# Patient Record
Sex: Female | Born: 1955 | Hispanic: Yes | Marital: Married | Smoking: Never smoker
Health system: Southern US, Community
[De-identification: ages and names within clinical notes are randomized; demographics above are authoritative.]

## PROBLEM LIST (undated history)

## (undated) DIAGNOSIS — K219 Gastro-esophageal reflux disease without esophagitis: Secondary | ICD-10-CM

## (undated) DIAGNOSIS — J45909 Unspecified asthma, uncomplicated: Secondary | ICD-10-CM

## (undated) DIAGNOSIS — E785 Hyperlipidemia, unspecified: Secondary | ICD-10-CM

## (undated) DIAGNOSIS — I10 Essential (primary) hypertension: Secondary | ICD-10-CM

## (undated) HISTORY — PX: NO PAST SURGERIES: SHX2092

---

## 2004-02-15 ENCOUNTER — Emergency Department: Payer: Self-pay | Admitting: Emergency Medicine

## 2004-02-15 ENCOUNTER — Other Ambulatory Visit: Payer: Self-pay

## 2004-02-16 ENCOUNTER — Emergency Department: Payer: Self-pay | Admitting: Internal Medicine

## 2004-02-16 ENCOUNTER — Ambulatory Visit: Payer: Self-pay | Admitting: Emergency Medicine

## 2004-02-16 ENCOUNTER — Other Ambulatory Visit: Payer: Self-pay

## 2004-03-04 ENCOUNTER — Emergency Department: Payer: Self-pay | Admitting: Unknown Physician Specialty

## 2010-01-02 ENCOUNTER — Ambulatory Visit: Payer: Self-pay

## 2011-07-11 DEATH — deceased

## 2013-05-07 ENCOUNTER — Ambulatory Visit: Payer: Self-pay | Admitting: Family Medicine

## 2013-06-20 ENCOUNTER — Emergency Department: Payer: Self-pay | Admitting: Emergency Medicine

## 2013-06-20 LAB — CBC
HCT: 40.6 % (ref 35.0–47.0)
HGB: 13.7 g/dL (ref 12.0–16.0)
MCH: 28.7 pg (ref 26.0–34.0)
MCHC: 33.7 g/dL (ref 32.0–36.0)
MCV: 85 fL (ref 80–100)
Platelet: 363 10*3/uL (ref 150–440)
RBC: 4.76 10*6/uL (ref 3.80–5.20)
RDW: 13.9 % (ref 11.5–14.5)
WBC: 13.7 10*3/uL — ABNORMAL HIGH (ref 3.6–11.0)

## 2013-06-20 LAB — BASIC METABOLIC PANEL
Anion Gap: 3 — ABNORMAL LOW (ref 7–16)
BUN: 16 mg/dL (ref 7–18)
CHLORIDE: 105 mmol/L (ref 98–107)
CO2: 29 mmol/L (ref 21–32)
Calcium, Total: 10 mg/dL (ref 8.5–10.1)
Creatinine: 0.61 mg/dL (ref 0.60–1.30)
EGFR (African American): >60
EGFR (Non-African Amer.): >60
Glucose: 99 mg/dL (ref 65–99)
OSMOLALITY: 275 (ref 275–301)
Potassium: 4.4 mmol/L (ref 3.5–5.1)
SODIUM: 137 mmol/L (ref 136–145)

## 2013-06-20 LAB — URINALYSIS, COMPLETE
Bilirubin,UR: NEGATIVE
Glucose,UR: 50 mg/dL (ref 0–75)
Ketone: NEGATIVE
NITRITE: NEGATIVE
PH: 6 (ref 4.5–8.0)
RBC,UR: 33676 /HPF (ref 0–5)
SQUAMOUS EPITHELIAL: NONE SEEN
Specific Gravity: 1.023 (ref 1.003–1.030)
WBC UR: 7937 /HPF (ref 0–5)

## 2013-06-23 LAB — URINE CULTURE

## 2015-01-31 ENCOUNTER — Other Ambulatory Visit: Payer: Self-pay | Admitting: Family Medicine

## 2015-01-31 DIAGNOSIS — Z1231 Encounter for screening mammogram for malignant neoplasm of breast: Secondary | ICD-10-CM

## 2015-02-09 ENCOUNTER — Ambulatory Visit
Admission: RE | Admit: 2015-02-09 | Discharge: 2015-02-09 | Disposition: A | Discharge status: Home / Self Care | Payer: PRIVATE HEALTH INSURANCE | Source: Ambulatory Visit | Attending: Family Medicine | Admitting: Family Medicine

## 2015-02-09 DIAGNOSIS — Z1231 Encounter for screening mammogram for malignant neoplasm of breast: Secondary | ICD-10-CM | POA: Diagnosis present

## 2016-02-27 ENCOUNTER — Emergency Department: Payer: BLUE CROSS/BLUE SHIELD

## 2016-02-27 ENCOUNTER — Encounter: Payer: Self-pay | Admitting: Internal Medicine

## 2016-02-27 ENCOUNTER — Inpatient Hospital Stay
Admission: EM | Admit: 2016-02-27 | Discharge: 2016-03-01 | DRG: 189 | Disposition: A | Discharge status: Home / Self Care | Payer: BLUE CROSS/BLUE SHIELD | Attending: Internal Medicine | Admitting: Internal Medicine

## 2016-02-27 DIAGNOSIS — E876 Hypokalemia: Secondary | ICD-10-CM | POA: Diagnosis not present

## 2016-02-27 DIAGNOSIS — J189 Pneumonia, unspecified organism: Secondary | ICD-10-CM | POA: Diagnosis not present

## 2016-02-27 DIAGNOSIS — Z800 Family history of malignant neoplasm of digestive organs: Secondary | ICD-10-CM | POA: Diagnosis not present

## 2016-02-27 DIAGNOSIS — Z803 Family history of malignant neoplasm of breast: Secondary | ICD-10-CM | POA: Diagnosis not present

## 2016-02-27 DIAGNOSIS — I4510 Unspecified right bundle-branch block: Secondary | ICD-10-CM | POA: Diagnosis present

## 2016-02-27 DIAGNOSIS — D72829 Elevated white blood cell count, unspecified: Secondary | ICD-10-CM | POA: Diagnosis present

## 2016-02-27 DIAGNOSIS — I10 Essential (primary) hypertension: Secondary | ICD-10-CM | POA: Diagnosis present

## 2016-02-27 DIAGNOSIS — J9601 Acute respiratory failure with hypoxia: Secondary | ICD-10-CM | POA: Diagnosis present

## 2016-02-27 DIAGNOSIS — Z79899 Other long term (current) drug therapy: Secondary | ICD-10-CM | POA: Diagnosis not present

## 2016-02-27 DIAGNOSIS — J441 Chronic obstructive pulmonary disease with (acute) exacerbation: Secondary | ICD-10-CM | POA: Diagnosis present

## 2016-02-27 DIAGNOSIS — J4521 Mild intermittent asthma with (acute) exacerbation: Secondary | ICD-10-CM | POA: Diagnosis not present

## 2016-02-27 DIAGNOSIS — E785 Hyperlipidemia, unspecified: Secondary | ICD-10-CM | POA: Diagnosis present

## 2016-02-27 DIAGNOSIS — K219 Gastro-esophageal reflux disease without esophagitis: Secondary | ICD-10-CM | POA: Diagnosis present

## 2016-02-27 HISTORY — DX: Unspecified asthma, uncomplicated: J45.909

## 2016-02-27 HISTORY — DX: Gastro-esophageal reflux disease without esophagitis: K21.9

## 2016-02-27 HISTORY — DX: Hyperlipidemia, unspecified: E78.5

## 2016-02-27 HISTORY — DX: Essential (primary) hypertension: I10

## 2016-02-27 LAB — CBC WITH DIFFERENTIAL/PLATELET
BASOS ABS: 0.1 10*3/uL (ref 0–0.1)
BASOS PCT: 0 %
Eosinophils Absolute: 0.3 10*3/uL (ref 0–0.7)
Eosinophils Relative: 2 %
HEMATOCRIT: 42.2 % (ref 35.0–47.0)
Hemoglobin: 14.7 g/dL (ref 12.0–16.0)
Lymphocytes Relative: 19 %
Lymphs Abs: 3.4 10*3/uL (ref 1.0–3.6)
MCH: 28.8 pg (ref 26.0–34.0)
MCHC: 34.7 g/dL (ref 32.0–36.0)
MCV: 82.9 fL (ref 80.0–100.0)
MONO ABS: 1.5 10*3/uL — AB (ref 0.2–0.9)
Monocytes Relative: 8 %
NEUTROS ABS: 12.2 10*3/uL — AB (ref 1.4–6.5)
NEUTROS PCT: 71 %
Platelets: 374 10*3/uL (ref 150–440)
RBC: 5.09 MIL/uL (ref 3.80–5.20)
RDW: 13.5 % (ref 11.5–14.5)
WBC: 17.4 10*3/uL — ABNORMAL HIGH (ref 3.6–11.0)

## 2016-02-27 LAB — COMPREHENSIVE METABOLIC PANEL
ALK PHOS: 68 U/L (ref 38–126)
ALT: 21 U/L (ref 14–54)
ANION GAP: 13 (ref 5–15)
AST: 24 U/L (ref 15–41)
Albumin: 4.5 g/dL (ref 3.5–5.0)
BUN: 15 mg/dL (ref 6–20)
CALCIUM: 9.6 mg/dL (ref 8.9–10.3)
CO2: 27 mmol/L (ref 22–32)
CREATININE: 0.63 mg/dL (ref 0.44–1.00)
Chloride: 94 mmol/L — ABNORMAL LOW (ref 101–111)
Glucose, Bld: 119 mg/dL — ABNORMAL HIGH (ref 65–99)
Potassium: 3.2 mmol/L — ABNORMAL LOW (ref 3.5–5.1)
Sodium: 134 mmol/L — ABNORMAL LOW (ref 135–145)
Total Bilirubin: 1.2 mg/dL (ref 0.3–1.2)
Total Protein: 8.5 g/dL — ABNORMAL HIGH (ref 6.5–8.1)

## 2016-02-27 LAB — INFLUENZA PANEL BY PCR (TYPE A & B)
H1N1 flu by pcr: NOT DETECTED
INFLAPCR: NEGATIVE
INFLBPCR: NEGATIVE

## 2016-02-27 LAB — MRSA PCR SCREENING: MRSA by PCR: NEGATIVE

## 2016-02-27 LAB — EXPECTORATED SPUTUM ASSESSMENT W REFEX TO RESP CULTURE

## 2016-02-27 LAB — TROPONIN I

## 2016-02-27 LAB — FIBRIN DERIVATIVES D-DIMER (ARMC ONLY): FIBRIN DERIVATIVES D-DIMER (ARMC): 484 (ref 0–499)

## 2016-02-27 LAB — MAGNESIUM: MAGNESIUM: 2.2 mg/dL (ref 1.7–2.4)

## 2016-02-27 LAB — GLUCOSE, CAPILLARY: GLUCOSE-CAPILLARY: 143 mg/dL — AB (ref 65–99)

## 2016-02-27 MED ORDER — CEFTRIAXONE SODIUM-DEXTROSE 1-3.74 GM-% IV SOLR
1.0000 g | INTRAVENOUS | Status: DC
  Filled 2016-02-27: qty 50

## 2016-02-27 MED ORDER — ATORVASTATIN CALCIUM 20 MG PO TABS
20.0000 mg | ORAL_TABLET | Freq: Every day | ORAL | Status: DC
  Administered 2016-02-28 – 2016-02-29 (×2): 20 mg via ORAL
  Filled 2016-02-27 (×2): qty 1

## 2016-02-27 MED ORDER — ACETAMINOPHEN 325 MG PO TABS
650.0000 mg | ORAL_TABLET | Freq: Four times a day (QID) | ORAL | Status: DC | PRN
Start: 2016-02-27 — End: 2016-03-01

## 2016-02-27 MED ORDER — PANTOPRAZOLE SODIUM 40 MG PO TBEC
40.0000 mg | DELAYED_RELEASE_TABLET | Freq: Every day | ORAL | Status: DC
  Administered 2016-02-28 – 2016-03-01 (×3): 40 mg via ORAL
  Filled 2016-02-27 (×3): qty 1

## 2016-02-27 MED ORDER — DEXTROSE 5 % IV SOLN: 1.0000 g | INTRAVENOUS | Status: DC

## 2016-02-27 MED ORDER — PREDNISONE 20 MG PO TABS: 20.0000 mg | ORAL_TABLET | Freq: Every day | ORAL | Status: DC

## 2016-02-27 MED ORDER — PNEUMOCOCCAL VAC POLYVALENT 25 MCG/0.5ML IJ INJ
0.5000 mL | INJECTION | INTRAMUSCULAR | Status: DC
  Filled 2016-02-27: qty 0.5

## 2016-02-27 MED ORDER — PREDNISONE 10 MG PO TABS: 5.0000 mg | ORAL_TABLET | Freq: Every day | ORAL | Status: DC

## 2016-02-27 MED ORDER — SODIUM CHLORIDE 0.9% FLUSH
3.0000 mL | Freq: Two times a day (BID) | INTRAVENOUS | Status: DC
  Administered 2016-02-27 – 2016-02-29 (×5): 3 mL via INTRAVENOUS

## 2016-02-27 MED ORDER — FLUTICASONE PROPIONATE 50 MCG/ACT NA SUSP
2.0000 | Freq: Every day | NASAL | Status: DC
  Administered 2016-02-28 – 2016-03-01 (×3): 2 via NASAL
  Filled 2016-02-27: qty 16

## 2016-02-27 MED ORDER — METHYLPREDNISOLONE SODIUM SUCC 125 MG IJ SOLR: 60.0000 mg | Freq: Four times a day (QID) | INTRAMUSCULAR | Status: DC

## 2016-02-27 MED ORDER — ALBUTEROL SULFATE (2.5 MG/3ML) 0.083% IN NEBU
5.0000 mg | INHALATION_SOLUTION | Freq: Once | RESPIRATORY_TRACT | Status: AC
  Administered 2016-02-27: 5 mg via RESPIRATORY_TRACT
  Filled 2016-02-27: qty 6

## 2016-02-27 MED ORDER — METHYLPREDNISOLONE SODIUM SUCC 125 MG IJ SOLR
125.0000 mg | Freq: Once | INTRAMUSCULAR | Status: AC
  Administered 2016-02-27: 125 mg via INTRAVENOUS
  Filled 2016-02-27: qty 2

## 2016-02-27 MED ORDER — ACETAMINOPHEN 650 MG RE SUPP: 650.0000 mg | Freq: Four times a day (QID) | RECTAL | Status: DC | PRN

## 2016-02-27 MED ORDER — CEFTRIAXONE SODIUM-DEXTROSE 1-3.74 GM-% IV SOLR
1.0000 g | Freq: Once | INTRAVENOUS | Status: AC
  Administered 2016-02-27: 1 g via INTRAVENOUS
  Filled 2016-02-27: qty 50

## 2016-02-27 MED ORDER — HYDROCHLOROTHIAZIDE 25 MG PO TABS
25.0000 mg | ORAL_TABLET | Freq: Every day | ORAL | Status: DC
  Administered 2016-02-28 – 2016-03-01 (×3): 25 mg via ORAL
  Filled 2016-02-27 (×3): qty 1

## 2016-02-27 MED ORDER — BUDESONIDE 0.5 MG/2ML IN SUSP: 0.5000 mg | Freq: Two times a day (BID) | RESPIRATORY_TRACT | Status: DC

## 2016-02-27 MED ORDER — CEFTRIAXONE SODIUM-DEXTROSE 1-3.74 GM-% IV SOLR: 1.0000 g | INTRAVENOUS | Status: DC

## 2016-02-27 MED ORDER — BUDESONIDE 0.5 MG/2ML IN SUSP
0.5000 mg | Freq: Two times a day (BID) | RESPIRATORY_TRACT | Status: DC
  Administered 2016-02-27 – 2016-03-01 (×6): 0.5 mg via RESPIRATORY_TRACT
  Filled 2016-02-27 (×7): qty 2

## 2016-02-27 MED ORDER — CHLORHEXIDINE GLUCONATE 0.12 % MT SOLN
15.0000 mL | Freq: Two times a day (BID) | OROMUCOSAL | Status: DC
  Administered 2016-02-27: 15 mL via OROMUCOSAL

## 2016-02-27 MED ORDER — ENOXAPARIN SODIUM 40 MG/0.4ML ~~LOC~~ SOLN
SUBCUTANEOUS | Status: AC
Start: 2016-02-27 — End: 2016-02-27
  Administered 2016-02-27: 40 mg via SUBCUTANEOUS
  Filled 2016-02-27: qty 0.4

## 2016-02-27 MED ORDER — IPRATROPIUM-ALBUTEROL 0.5-2.5 (3) MG/3ML IN SOLN
RESPIRATORY_TRACT | Status: AC
  Administered 2016-02-27: 13:00:00
  Filled 2016-02-27: qty 3

## 2016-02-27 MED ORDER — LISINOPRIL 20 MG PO TABS
20.0000 mg | ORAL_TABLET | Freq: Every day | ORAL | Status: DC
  Administered 2016-02-28 – 2016-03-01 (×3): 20 mg via ORAL
  Filled 2016-02-27 (×3): qty 1

## 2016-02-27 MED ORDER — PREDNISONE 10 MG PO TABS: 10.0000 mg | ORAL_TABLET | Freq: Every day | ORAL | Status: DC

## 2016-02-27 MED ORDER — BUDESONIDE 0.5 MG/2ML IN SUSP
0.5000 mg | Freq: Two times a day (BID) | RESPIRATORY_TRACT | Status: DC
  Filled 2016-02-27: qty 2

## 2016-02-27 MED ORDER — PREDNISONE 20 MG PO TABS
30.0000 mg | ORAL_TABLET | Freq: Every day | ORAL | Status: DC
  Administered 2016-03-01: 08:00:00 30 mg via ORAL
  Filled 2016-02-27: qty 1

## 2016-02-27 MED ORDER — DEXTROSE 5 % IV SOLN: 1.0000 g | Freq: Once | INTRAVENOUS | Status: DC

## 2016-02-27 MED ORDER — DEXTROSE 5 % IV SOLN: 500.0000 mg | Freq: Once | INTRAVENOUS | Status: DC

## 2016-02-27 MED ORDER — ENOXAPARIN SODIUM 40 MG/0.4ML ~~LOC~~ SOLN
40.0000 mg | SUBCUTANEOUS | Status: DC
  Administered 2016-02-27 – 2016-02-29 (×3): 40 mg via SUBCUTANEOUS
  Filled 2016-02-27 (×4): qty 0.4

## 2016-02-27 MED ORDER — AZITHROMYCIN 250 MG PO TABS
250.0000 mg | ORAL_TABLET | Freq: Every day | ORAL | Status: DC
  Administered 2016-02-28: 250 mg via ORAL
  Filled 2016-02-27: qty 1

## 2016-02-27 MED ORDER — ORAL CARE MOUTH RINSE
15.0000 mL | Freq: Two times a day (BID) | OROMUCOSAL | Status: DC
  Administered 2016-02-29 – 2016-03-01 (×3): 15 mL via OROMUCOSAL

## 2016-02-27 MED ORDER — IPRATROPIUM-ALBUTEROL 0.5-2.5 (3) MG/3ML IN SOLN
3.0000 mL | Freq: Four times a day (QID) | RESPIRATORY_TRACT | Status: DC
  Administered 2016-02-27 – 2016-02-29 (×8): 3 mL via RESPIRATORY_TRACT
  Filled 2016-02-27 (×9): qty 3

## 2016-02-27 MED ORDER — SODIUM CHLORIDE 0.9% FLUSH
3.0000 mL | Freq: Two times a day (BID) | INTRAVENOUS | Status: DC
  Administered 2016-02-27 – 2016-03-01 (×6): 3 mL via INTRAVENOUS

## 2016-02-27 MED ORDER — INFLUENZA VAC SPLIT QUAD 0.5 ML IM SUSY
0.5000 mL | PREFILLED_SYRINGE | INTRAMUSCULAR | Status: AC
  Administered 2016-02-28: 0.5 mL via INTRAMUSCULAR
  Filled 2016-02-27: qty 0.5

## 2016-02-27 MED ORDER — PREDNISONE 20 MG PO TABS
40.0000 mg | ORAL_TABLET | Freq: Every day | ORAL | Status: AC
  Administered 2016-02-28 – 2016-02-29 (×2): 40 mg via ORAL
  Filled 2016-02-27 (×2): qty 2

## 2016-02-27 MED ORDER — POTASSIUM CHLORIDE CRYS ER 20 MEQ PO TBCR
40.0000 meq | EXTENDED_RELEASE_TABLET | Freq: Once | ORAL | Status: AC
  Administered 2016-02-27: 40 meq via ORAL

## 2016-02-27 MED ORDER — LISINOPRIL-HYDROCHLOROTHIAZIDE 20-25 MG PO TABS: 1.0000 | ORAL_TABLET | Freq: Every day | ORAL | Status: DC

## 2016-02-27 MED ORDER — PREDNISOLONE 5 MG PO TABS
40.0000 mg | ORAL_TABLET | Freq: Every day | ORAL | Status: DC
  Filled 2016-02-27: qty 8

## 2016-02-27 MED ORDER — ORAL CARE MOUTH RINSE: 15.0000 mL | Freq: Two times a day (BID) | OROMUCOSAL | Status: DC

## 2016-02-27 MED ORDER — POTASSIUM CHLORIDE CRYS ER 20 MEQ PO TBCR
EXTENDED_RELEASE_TABLET | ORAL | Status: AC
  Administered 2016-02-27: 40 meq via ORAL
  Filled 2016-02-27: qty 2

## 2016-02-27 MED ORDER — BENZONATATE 100 MG PO CAPS
200.0000 mg | ORAL_CAPSULE | Freq: Three times a day (TID) | ORAL | Status: DC
  Administered 2016-02-27 – 2016-03-01 (×9): 200 mg via ORAL
  Filled 2016-02-27 (×9): qty 2

## 2016-02-27 MED ORDER — AZITHROMYCIN 250 MG PO TABS
500.0000 mg | ORAL_TABLET | Freq: Once | ORAL | Status: AC
  Administered 2016-02-27: 500 mg via ORAL
  Filled 2016-02-27: qty 2

## 2016-02-27 MED ORDER — MAGNESIUM SULFATE 2 GM/50ML IV SOLN
2.0000 g | Freq: Once | INTRAVENOUS | Status: AC
  Administered 2016-02-27: 2 g via INTRAVENOUS
  Filled 2016-02-27: qty 50

## 2016-02-27 MED ORDER — GUAIFENESIN-DM 100-10 MG/5ML PO SYRP
5.0000 mL | ORAL_SOLUTION | ORAL | Status: DC | PRN
  Administered 2016-02-27 – 2016-02-29 (×4): 5 mL via ORAL
  Filled 2016-02-27 (×4): qty 5

## 2016-02-27 NOTE — ED Provider Notes (Signed)
[facility] Emergency Department Provider Note    None    (approximate)  I have reviewed the triage vital signs and the nursing notes.   HISTORY  Chief Complaint Shortness of Breath  Level V caveat:  Acute respiratory distress   HPI Deborah Washington is a 60 y.o. female with a history of asthma without known cardiac disease presents with acute respiratory distress with hypoxia. Patient has been treated for asthma exacerbation for the past several weeks. Has had multiple treatments with steroids as well as albuterol inhalers. Denies any recent fevers or chest pain. No lower extremity swelling. States that she woke up this morning with severe shortness of breath and inability to speak. Patient only able to speak in short phrases. Patient does not wear oxygen at home. She does not smoke. She is currently on a steroid taper.   Past medical history of asthma  Patient Active Problem List   Diagnosis Date Noted  . Acute respiratory failure with hypoxia (HCC) 02/27/2016    No recent surgeries  Prior to Admission medications   Medication Sig Start Date End Date Taking? Authorizing Provider  albuterol (ACCUNEB) 1.25 MG/3ML nebulizer solution Inhale 3 mLs into the lungs every 6 (six) hours as needed.  02/22/16 02/21/17 Yes Historical Provider, MD  albuterol (PROVENTIL HFA;VENTOLIN HFA) 108 (90 Base) MCG/ACT inhaler Inhale into the lungs. 02/22/16  Yes Historical Provider, MD  atorvastatin (LIPITOR) 20 MG tablet Take 20 mg by mouth daily.  11/21/15 11/20/16 Yes Historical Provider, MD  beclomethasone (QVAR) 40 MCG/ACT inhaler Inhale 1 puff into the lungs 2 (two) times daily.  02/22/16  Yes Historical Provider, MD  benzonatate (TESSALON) 200 MG capsule Take 200 mg by mouth 3 (three) times daily.    Yes Historical Provider, MD  fluticasone (FLONASE) 50 MCG/ACT nasal spray Place 2 sprays into the nose daily.    Yes Historical Provider, MD    lisinopril-hydrochlorothiazide (PRINZIDE,ZESTORETIC) 20-25 MG tablet Take 1 tablet by mouth daily.  02/22/16 02/21/17 Yes Historical Provider, MD  omeprazole (PRILOSEC) 20 MG capsule 20 mg daily.  10/20/14  Yes Historical Provider, MD  predniSONE (DELTASONE) 20 MG tablet Take 60 mg by mouth.  02/26/16 03/07/16 Yes Historical Provider, MD    Allergies Review of patient's allergies indicates no known allergies.  Family History  Problem Relation Age of Onset  . Breast cancer Sister 38  . Stomach cancer Mother   . Liver disease Father     Social History Social History  Substance Use Topics  . Smoking status: Never Smoker  . Smokeless tobacco: Never Used  . Alcohol use Yes     Comment: social    Review of Systems Patient denies headaches, rhinorrhea, blurry vision, numbness, shortness of breath, chest pain, edema, cough, abdominal pain, nausea, vomiting, diarrhea, dysuria, fevers, rashes or hallucinations unless otherwise stated above in HPI. ____________________________________________   PHYSICAL EXAM:  VITAL SIGNS: Vitals:   02/27/16 1330 02/27/16 1357  BP: (!) 141/63 (!) 146/67  Pulse: (!) 106 (!) 106  Resp: (!) 26   Temp:  98.5 F (36.9 C)    Constitutional: Alert and oriented. Acutely ill patient who arrives in moderate respiratory distress Eyes: Conjunctivae are normal. PERRL. EOMI. Head: Atraumatic. Nose: No congestion/rhinnorhea. Mouth/Throat: Mucous membranes are moist.  Oropharynx non-erythematous. Neck: No stridor. Painless ROM. No cervical spine tenderness to palpation Hematological/Lymphatic/Immunilogical: No cervical lymphadenopathy. Cardiovascular: Tachycardic, regular rhythm. Grossly normal heart sounds.  Good peripheral circulation. Respiratory: Patient is tripoding, prolonged expiratory  phase, tachypnea diminished breath sounds bilaterally with faint  Inspiratory wheezing.  Gastrointestinal: Soft and nontender. No distention. No abdominal bruits. No CVA  tenderness. Genitourinary:  Musculoskeletal: No lower extremity tenderness nor edema.  No joint effusions. Neurologic:  Normal speech and language. No gross focal neurologic deficits are appreciated. No gait instability. Skin:  Skin is warm, dry and intact. No rash noted. Psychiatric: Mood and affect are normal. Speech and behavior are normal.  ____________________________________________   LABS (all labs ordered are listed, but only abnormal results are displayed)  Results for orders placed or performed during the hospital encounter of 02/27/16 (from the past 24 hour(s))  Magnesium     Status: None   Collection Time: 02/27/16  9:48 AM  Result Value Ref Range   Magnesium 2.2 1.7 - 2.4 mg/dL  Troponin I     Status: None   Collection Time: 02/27/16  9:48 AM  Result Value Ref Range   Troponin I <0.03 <0.03 ng/mL  CBC with Differential/Platelet     Status: Abnormal   Collection Time: 02/27/16  9:48 AM  Result Value Ref Range   WBC 17.4 (H) 3.6 - 11.0 K/uL   RBC 5.09 3.80 - 5.20 MIL/uL   Hemoglobin 14.7 12.0 - 16.0 g/dL   HCT 62.9 52.8 - 41.3 %   MCV 82.9 80.0 - 100.0 fL   MCH 28.8 26.0 - 34.0 pg   MCHC 34.7 32.0 - 36.0 g/dL   RDW 24.4 01.0 - 27.2 %   Platelets 374 150 - 440 K/uL   Neutrophils Relative % 71 %   Neutro Abs 12.2 (H) 1.4 - 6.5 K/uL   Lymphocytes Relative 19 %   Lymphs Abs 3.4 1.0 - 3.6 K/uL   Monocytes Relative 8 %   Monocytes Absolute 1.5 (H) 0.2 - 0.9 K/uL   Eosinophils Relative 2 %   Eosinophils Absolute 0.3 0 - 0.7 K/uL   Basophils Relative 0 %   Basophils Absolute 0.1 0 - 0.1 K/uL  Comprehensive metabolic panel     Status: Abnormal   Collection Time: 02/27/16  9:48 AM  Result Value Ref Range   Sodium 134 (L) 135 - 145 mmol/L   Potassium 3.2 (L) 3.5 - 5.1 mmol/L   Chloride 94 (L) 101 - 111 mmol/L   CO2 27 22 - 32 mmol/L   Glucose, Bld 119 (H) 65 - 99 mg/dL   BUN 15 6 - 20 mg/dL   Creatinine, Ser 5.36 0.44 - 1.00 mg/dL   Calcium 9.6 8.9 - 64.4 mg/dL    Total Protein 8.5 (H) 6.5 - 8.1 g/dL   Albumin 4.5 3.5 - 5.0 g/dL   AST 24 15 - 41 U/L   ALT 21 14 - 54 U/L   Alkaline Phosphatase 68 38 - 126 U/L   Total Bilirubin 1.2 0.3 - 1.2 mg/dL   GFR calc non Af Amer >60 >60 mL/min   GFR calc Af Amer >60 >60 mL/min   Anion gap 13 5 - 15  Fibrin derivatives D-Dimer ([facility] only)     Status: None   Collection Time: 02/27/16  9:48 AM  Result Value Ref Range   Fibrin derivatives D-dimer ([facility]) 484 0 - 499  Influenza panel by PCR (type A & B, H1N1)     Status: None   Collection Time: 02/27/16  1:05 PM  Result Value Ref Range   Influenza A By PCR NEGATIVE NEGATIVE   Influenza B By PCR NEGATIVE NEGATIVE   H1N1 flu by  pcr NOT DETECTED NOT DETECTED  Glucose, capillary     Status: Abnormal   Collection Time: 02/27/16  2:08 PM  Result Value Ref Range   Glucose-Capillary 143 (H) 65 - 99 mg/dL   ____________________________________________  EKG My review and personal interpretation at Time: 9:40   Indication: sob  Rate: 100  Rhythm: sinus Axis: normal Other: st depressions in inferior leads with t wave inversions laterally, ST eelvation in AVR <391mm ____________________________________________  RADIOLOGY  I personally reviewed all radiographic images ordered to evaluate for the above acute complaints and reviewed radiology reports and findings.  These findings were personally discussed with the patient.  Please see medical record for radiology report.  ____________________________________________   PROCEDURES  Procedure(s) performed: none    Critical Care performed: yes CRITICAL CARE Performed by: Willy EddyPatrick Hillery Bhalla   Total critical care time: 40 minutes  Critical care time was exclusive of separately billable procedures and treating other patients.  Critical care was necessary to treat or prevent imminent or life-threatening deterioration.  Critical care was time spent personally by me on the following activities: development of  treatment plan with patient and/or surrogate as well as nursing, discussions with consultants, evaluation of patient's response to treatment, examination of patient, obtaining history from patient or surrogate, ordering and performing treatments and interventions, ordering and review of laboratory studies, ordering and review of radiographic studies, pulse oximetry and re-evaluation of patient's condition.  ____________________________________________   INITIAL IMPRESSION / ASSESSMENT AND PLAN / ED COURSE  Pertinent labs & imaging results that were available during my care of the patient were reviewed by me and considered in my medical decision making (see chart for details).  ZOX:[id number]:Asthma, copd, CHF, pna, ptx, malignancy, Pe, anemia   Deborah LaoMaria T Gonzalez Washington is a 60 y.o. who presents to the ED with acute respiratory distress with hypoxia. Patient does arrive critically ill with new hypoxia at 85% and tachycardia. Examination history is concerning for severe asthma exacerbation. No physical exam findings consistent with CHF. No recent fevers. EKG concerning for ischemia but not an STEMI. We will place patient on BiPAP as well as inhaled belies errors, IV steroids as well as IV magnesium.  The patient will be placed on continuous pulse oximetry and telemetry for monitoring.  Laboratory evaluation will be sent to evaluate for the above complaints.     Clinical Course  Comment By Time  Patient with improvement after multiple nebulizer treatments, IV magnesium and steroids. Based on her worsening condition do feel patient will require IV antibiotics. Patient remains to With new hypoxia. Do feel patient will require admission for further evaluation and management. Willy EddyPatrick Taira Knabe, MD 10/18 1035     ____________________________________________   FINAL CLINICAL IMPRESSION(S) / ED DIAGNOSES  Final diagnoses:  Acute respiratory failure with hypoxemia (HCC)  COPD exacerbation (HCC)  Community  acquired pneumonia, unspecified laterality      NEW MEDICATIONS STARTED DURING THIS VISIT:  Current Discharge Medication List       Note:  This document was prepared using Dragon voice recognition software and may include unintentional dictation errors.    Willy EddyPatrick Eulon Allnutt, MD 02/27/16 [phone]

## 2016-02-27 NOTE — ED Triage Notes (Signed)
Reports asthma flaring up for 2 wks, worse this am.

## 2016-02-27 NOTE — Consult Note (Signed)
Name: Deborah Washington MRN: [id number] DOB: 08/08/1955    ADMISSION DATE:  02/27/2016 CONSULTATION DATE:  02/27/16  REFERRING MD :  Dr. Renae GlossWieting  CHIEF COMPLAINT:  Shortness of breath and wheezing  BRIEF PATIENT DESCRIPTION: 60 y.o. Hispanic female with known history of Asthma, has been having asthma symptoms (SOB and wheezing) for approximately 3 weeks, which have been manageable with her rescue inhaler.  Symptoms continued to progress to where she saw her PCP 10/17 and was prescribed prednisone taper and Q-var inhaler.  Pt symptoms continue to progress, [facility]/18 due to severe Shortness of breath yielding inability to speak in words or phrases.  SIGNIFICANT EVENTS  10/18>>Admission to [facility]  STUDIES:  02/27/16>>H1N1 flu NOT DETECTED by pcr 02/27/16>> Influenza A NEGATIVE pcr 02/27/16>> CXR>>no acute disease processes 02/27/16>>Urinalysis>>1+ Leukocyte Esterase, trace bacteria  HISTORY OF PRESENT ILLNESS:   Pt is a 60 y.o. Hispanic female with PMH of HTN, Hyperlipidemia, GERD, and asthma.  [facility] with respiratory distress with hypoxia.  She woke up this morning with severe shortness of breath and inability to speak due to SOB.  Pt reports approximately a 3 week history of asthma symptoms (SOB, wheezing) which was managed with her rescue inhaler.  Asthma symptoms progressed, thus she followed up with her PCP 10/17 and was presecribed prednisone taper and Q-Air inhaler.  In the ED, pt has received duonebs, IV steroids, IV magnesium, and placed on BiPAP.  Continues to have wheezing and shortness of breath.  PCCM is consulted for further management of asthma exacerbation.  PAST MEDICAL HISTORY :   has a past medical history of Asthma; GERD (gastroesophageal reflux disease); Hyperlipidemia; and Hypertension.  has a past surgical history that includes No past surgeries. Prior to Admission medications   Medication Sig Start Date End Date Taking?  Authorizing Provider  albuterol (ACCUNEB) 1.25 MG/3ML nebulizer solution Inhale 3 mLs into the lungs every 6 (six) hours as needed.  02/22/16 02/21/17 Yes Historical Provider, MD  albuterol (PROVENTIL HFA;VENTOLIN HFA) 108 (90 Base) MCG/ACT inhaler Inhale into the lungs. 02/22/16  Yes Historical Provider, MD  atorvastatin (LIPITOR) 20 MG tablet Take 20 mg by mouth daily.  11/21/15 11/20/16 Yes Historical Provider, MD  beclomethasone (QVAR) 40 MCG/ACT inhaler Inhale 1 puff into the lungs 2 (two) times daily.  02/22/16  Yes Historical Provider, MD  benzonatate (TESSALON) 200 MG capsule Take 200 mg by mouth 3 (three) times daily.    Yes Historical Provider, MD  fluticasone (FLONASE) 50 MCG/ACT nasal spray Place 2 sprays into the nose daily.    Yes Historical Provider, MD  lisinopril-hydrochlorothiazide (PRINZIDE,ZESTORETIC) 20-25 MG tablet Take 1 tablet by mouth daily.  02/22/16 02/21/17 Yes Historical Provider, MD  omeprazole (PRILOSEC) 20 MG capsule 20 mg daily.  10/20/14  Yes Historical Provider, MD  predniSONE (DELTASONE) 20 MG tablet Take 60 mg by mouth.  02/26/16 03/07/16 Yes Historical Provider, MD   No Known Allergies  FAMILY HISTORY:  family history includes Breast cancer (age of onset: 4020) in her sister; Liver disease in her father; Stomach cancer in her mother. SOCIAL HISTORY:  reports that she has never smoked. She has never used smokeless tobacco. She reports that she drinks alcohol. She reports that she does not use drugs.  REVIEW OF SYSTEMS:  Positives in BOLD Constitutional: Negative for fever, chills, weight loss, malaise/fatigue and diaphoresis.  HENT: Negative for hearing loss, ear pain, nosebleeds, congestion, sore throat, neck pain, tinnitus and ear discharge.  Eyes: Negative for blurred vision, double vision, photophobia, pain, discharge and redness.  Respiratory: Negative for cough, hemoptysis, sputum production, shortness of breath, wheezing and stridor.   Cardiovascular:  Negative for chest pain, palpitations, orthopnea, claudication, leg swelling and PND.  Gastrointestinal: Negative for heartburn, nausea, vomiting, abdominal pain, diarrhea, constipation, blood in stool and melena.  Genitourinary: Negative for dysuria, urgency, frequency, hematuria and flank pain.  Musculoskeletal: Negative for myalgias, back pain, joint pain and falls.  Skin: Negative for itching and rash.  Neurological: Negative for dizziness, tingling, tremors, sensory change, speech change, focal weakness, seizures, loss of consciousness, weakness and headaches.  Endo/Heme/Allergies: Negative for environmental allergies and polydipsia. Does not bruise/bleed easily.  SUBJECTIVE:  Pt states her SOB has improved somewhat since receiving treatment in ED  VITAL SIGNS: Temp:  [98.4 F (36.9 C)-98.5 F (36.9 C)] 98.5 F (36.9 C) (10/18 1357) Pulse Rate:  [98-109] 106 (10/18 1357) Resp:  [22-27] 26 (10/18 1330) BP: (123-146)/(58-69) 146/67 (10/18 1357) SpO2:  [85 %-100 %] 93 % (10/18 1357) Weight:  [213 lb 13.5 oz (97 kg)] 213 lb 13.5 oz (97 kg) (10/18 1357)  PHYSICAL EXAMINATION: General:  Well nourished, lying in bed, no acute distress Neuro:  Alert and oriented x4, CN II-XII intact HEENT:  Atraumatic normocephalic, PERRLA, No JVD, neck supple Cardiovascular:  RRR, s1s2, No R/M/G. Lungs:  Expiratory wheezing throughout, no assessory muscle use, symmetric expansion Abdomen:  Obese, bowel sounds present, soft, nontender Musculoskeletal:  ROM intact, no peripheral edema, no cyanosis or clubbing Skin:  No rashes, lesions, or ulcers   Recent Labs Lab 02/27/16 0948  NA 134*  K 3.2*  CL 94*  CO2 27  BUN 15  CREATININE 0.63  GLUCOSE 119*    Recent Labs Lab 02/27/16 0948  HGB 14.7  HCT 42.2  WBC 17.4*  PLT 374   Dg Chest Portable 1 View  Result Date: 02/27/2016 CLINICAL DATA:  Shortness of breath, difficulty breathing EXAM: PORTABLE CHEST 1 VIEW COMPARISON:  05/07/2013  FINDINGS: Lungs are clear.  No pleural effusion or pneumothorax. The heart is normal size. IMPRESSION: No evidence of acute cardiopulmonary disease. Electronically Signed   By: Charline Bills M.D.   On: 02/27/2016 10:20    ASSESSMENT / PLAN:  PULMONARY A:  Acute Asthma Exacerbation P: Duonebs as scheduled and prn IV steroids.  May consider transition to prednisone taper tomorrow Supplemental O2 to keep O2 sats >92%  Pulmonary and Critical Care Medicine [facility] Pager: [phone]  02/27/2016, 3:41 PM   STAFF NOTE: I, Dr. Stephanie Acre have personally reviewed patient's available data, including medical history, events of note, physical examination and test results as part of my evaluation. I have discussed with  NP Blakeney and other care providers such as pharmacist, RN and RRT.  In addition,  I personally evaluated patient and elicited key findings of   HPI:  61 yo female with PMHx of Asthma (mild-intermittent, only on rescue inhaler), GERD, HLD, HTN, seen in consultation for asthma exacerbation.  Per patient and daughter sitting at the bedside. She states that over the last 2 weeks she has had ongoing shortness of breath, acutely got worse overnight, and now with whitish to yellow sputum. She admits to wheezing and some intermittent chest pain along with cough. She has been seeing a nurse practitioner in the outpatient over the last week or so, review of records show that she was given a prescription for prednisone and Qvar yesterday, however has not had a chance  to fill it. She was admitted by the hospitalist service however upon transport, she noted to be descending down to the upper 80s and was placed on BiPAP, at which point pulmonary was consulted. Patient got 1 g of mag in the ER and also 125 mg of Solu-Medrol as part of her initial workup, flu PCR is pending. As for her asthma history, she was diagnosed about 3 years ago, has mild intermittent asthma on requiring a  rescue inhaler, triggers are rapid weather changes, Spring, and more asthma type symptoms during the fall, daughter states that when she does have asthma exacerbations in the past that usually associated with either a bronchitis or some type of pneumonia. I have reviewed her chest x-ray today, there are no infiltrates to suggest a pneumonia.  O:  GEN-NAD, on [place], no use of accessory muscles HEENT-PERRLA, [place]/AT CVS-S1, S2, mild tachycardia LUNGS-shallow BS, faint expiratory wheezes, no rale, no crackles ABD-soft, nt, nd, +BS MSK-no lesions  Recent Labs CBC Latest Ref Rng & Units 02/27/2016 06/20/2013  WBC 3.6 - 11.0 K/uL 17.4(H) 13.7(H)  Hemoglobin 12.0 - 16.0 g/dL 16.1 09.6  Hematocrit 04.5 - 47.0 % 42.2 40.6  Platelets 150 - 440 K/uL 374 363      Recent Labs BMP Latest Ref Rng & Units 02/27/2016 06/20/2013  Glucose 65 - 99 mg/dL 409(W) 99  BUN 6 - 20 mg/dL 15 16  Creatinine 1.19 - 1.00 mg/dL 1.47 8.29  Sodium 562 - 145 mmol/L 134(L) 137  Potassium 3.5 - 5.1 mmol/L 3.2(L) 4.4  Chloride 101 - 111 mmol/L 94(L) 105  CO2 22 - 32 mmol/L 27 29  Calcium 8.9 - 10.3 mg/dL 9.6 13.0       (The following images and results were reviewed by Dr. Dema Severin on 02/27/2016). Dg Chest Portable 1 View  Result Date: 02/27/2016 CLINICAL DATA:  Shortness of breath, difficulty breathing EXAM: PORTABLE CHEST 1 VIEW COMPARISON:  05/07/2013 FINDINGS: Lungs are clear.  No pleural effusion or pneumothorax. The heart is normal size. IMPRESSION: No evidence of acute cardiopulmonary disease. Electronically Signed   By: Charline Bills M.D.   On: 02/27/2016 10:20      A:60 yo F history of mild intermittent asthma, now with asthma exacerbation secondary to environmental changes.  Asthma Asthma exacerbation Hyperlipidemia Hypertension Hypokalemia   P:  -Keep O2 sats greater than 90%, may use supplemental O2 -Continue with steroids, bronchodilators, etc. spirometer, flutter valve -Continue with  CAP treatment, may use Rocephin 3 days, in addition to Z-Pak -Start prednisone 40 mg, taper over 10 days, Pulmicort twice a day 3 days, upon discharge to resume Qvar 40 g 1 puff twice a day -Continue with Flonase -Continue with potassium treatment -Continue Tessalon   .  Rest per NP/medical resident whose note is outlined above and that I agree with  Pulmonary Care Time devoted to patient care services described in this note is  40 Minutes.   This time reflects time of care of this signee Dr Stephanie Acre.  This critical care time does not reflect procedure time, or teaching time or supervisory time of PA/NP/Med-student/Med Resident etc but could involve care discussion time.  Stephanie Acre, MD [facility] Pager [phone] (please enter 7-digits) On Call Pager - [phone] (please enter 7-digits)  Note: This note was prepared with Dragon dictation along with smaller phrase technology. Any transcriptional errors that result from this process are unintentional.

## 2016-02-27 NOTE — Progress Notes (Signed)
Pt. Transported to CCU 18 from the ED while on the BIPAP.

## 2016-02-27 NOTE — ED Notes (Signed)
Attempted x1 w/ no success

## 2016-02-27 NOTE — Progress Notes (Signed)
Pt. Was taken off BIPAP and placed on 2L nasal cannula after entering the CCU. Sa02 were 92%.

## 2016-02-27 NOTE — H&P (Signed)
[facility] -  at [facility]   PATIENT NAME: Deborah Washington    MR#:  [id number]  DATE OF BIRTH:  1955/07/05  DATE OF ADMISSION:  02/27/2016  PRIMARY CARE PHYSICIAN: WHITE, Arlyss Repress, NP   REQUESTING/REFERRING PHYSICIAN: Dr Willy Eddy  CHIEF COMPLAINT:   Chief Complaint  Patient presents with  . Shortness of Breath    HISTORY OF PRESENT ILLNESS:  Deborah Washington  is a 60 y.o. female with a known history of Asthma, presents with shortness of breath Going on for the last 2 weeks. Last night it worsened. She's been coughing up whitish yellow phlegm. She's been wheezing. She has chest pain that comes and goes but worse with coughing. Looks like she was started on prednisone as outpatient. The ER physician called me for admission. After he called May he placed the patient on BiPAP. Hospitalist services were contacted for asthma exacerbation and acute respiratory failure. I used the hospital provided translator to communicate with the patient.  PAST MEDICAL HISTORY:   Past Medical History:  Diagnosis Date  . Asthma   . GERD (gastroesophageal reflux disease)   . Hyperlipidemia   . Hypertension     PAST SURGICAL HISTORY:   Past Surgical History:  Procedure Laterality Date  . NO PAST SURGERIES      SOCIAL HISTORY:   Social History  Substance Use Topics  . Smoking status: Never Smoker  . Smokeless tobacco: Never Used  . Alcohol use Yes     Comment: social    FAMILY HISTORY:   Family History  Problem Relation Age of Onset  . Breast cancer Sister 80  . Stomach cancer Mother   . Liver disease Father     DRUG ALLERGIES:  No Known Allergies  REVIEW OF SYSTEMS:  CONSTITUTIONAL: No fever. Positive for sweating. Occasional fatigue. Positive for weight gain of 5 pounds EYES: No blurred or double vision. Wears glasses and has burning of the eyes. EARS, NOSE, AND THROAT: Right ear tinnitus. Positive for runny nose  and postnasal drip. No sore throat. Tongue with taste change RESPIRATORY: Positive for cough, shortness of breath, and wheezing. No hemoptysis.  CARDIOVASCULAR: Positive for chest pain. No orthopnea, edema.  GASTROINTESTINAL: No nausea, vomiting, diarrhea or abdominal pain. No blood in bowel movements. Occasional constipation GENITOURINARY: Positive for blood in the urine and burning on urination ENDOCRINE: No polyuria, nocturia,  HEMATOLOGY: No anemia, easy bruising or bleeding SKIN: Occasional rash abdomen MUSCULOSKELETAL: Positive for joint pain.   NEUROLOGIC: No tingling, numbness, weakness.  PSYCHIATRY: Occasional anxiety, depression.   MEDICATIONS AT HOME:   Prior to Admission medications   Medication Sig Start Date End Date Taking? Authorizing Provider  albuterol (ACCUNEB) 1.25 MG/3ML nebulizer solution Inhale 3 mLs into the lungs every 6 (six) hours as needed.  02/22/16 02/21/17 Yes Historical Provider, MD  albuterol (PROVENTIL HFA;VENTOLIN HFA) 108 (90 Base) MCG/ACT inhaler Inhale into the lungs. 02/22/16  Yes Historical Provider, MD  atorvastatin (LIPITOR) 20 MG tablet Take 20 mg by mouth daily.  11/21/15 11/20/16 Yes Historical Provider, MD  beclomethasone (QVAR) 40 MCG/ACT inhaler Inhale 1 puff into the lungs 2 (two) times daily.  02/22/16  Yes Historical Provider, MD  benzonatate (TESSALON) 200 MG capsule Take 200 mg by mouth 3 (three) times daily.    Yes Historical Provider, MD  fluticasone (FLONASE) 50 MCG/ACT nasal spray Place 2 sprays into the nose daily.    Yes Historical Provider, MD  lisinopril-hydrochlorothiazide (PRINZIDE,ZESTORETIC) 20-25 MG tablet Take  1 tablet by mouth daily.  02/22/16 02/21/17 Yes Historical Provider, MD  omeprazole (PRILOSEC) 20 MG capsule 20 mg daily.  10/20/14  Yes Historical Provider, MD  predniSONE (DELTASONE) 20 MG tablet Take 60 mg by mouth.  02/26/16 03/07/16 Yes Historical Provider, MD      VITAL SIGNS:  Blood pressure 130/60, pulse (!)  103, resp. rate (!) 26, SpO2 100 %.  PHYSICAL EXAMINATION:  GENERAL:  60 y.o.-year-old patient lying in the bed Breathing better once placed on BiPAP EYES: Pupils equal, round, reactive to light and accommodation. No scleral icterus. Extraocular muscles intact.  HEENT: Head atraumatic, normocephalic. Oropharynx and nasopharynx clear.  NECK:  Supple, no jugular venous distention. No thyroid enlargement, no tenderness.  LUNGS: Decreased breath sounds bilaterally, positive expiratory wheezing. No rales,rhonchi or crepitation. Positive use of accessory muscles of respiration.  CARDIOVASCULAR: S1, S2 tachycardic. No murmurs, rubs, or gallops.  ABDOMEN: Soft, nontender, nondistended. Bowel sounds present. No organomegaly or mass.  EXTREMITIES: No pedal edema, cyanosis, or clubbing.  NEUROLOGIC: Cranial nerves II through XII are intact. Muscle strength 5/5 in all extremities. Sensation intact. Gait not checked.  PSYCHIATRIC: The patient is alert and oriented x 3.  SKIN: No rash, lesion, or ulcer.   LABORATORY PANEL:   CBC  Recent Labs Lab 02/27/16 0948  WBC 17.4*  HGB 14.7  HCT 42.2  PLT 374   ------------------------------------------------------------------------------------------------------------------  Chemistries   Recent Labs Lab 02/27/16 0948  NA 134*  K 3.2*  CL 94*  CO2 27  GLUCOSE 119*  BUN 15  CREATININE 0.63  CALCIUM 9.6  MG 2.2  AST 24  ALT 21  ALKPHOS 68  BILITOT 1.2   ------------------------------------------------------------------------------------------------------------------  Cardiac Enzymes  Recent Labs Lab 02/27/16 0948  TROPONINI <0.03   ------------------------------------------------------------------------------------------------------------------  RADIOLOGY:  Dg Chest Portable 1 View  Result Date: 02/27/2016 CLINICAL DATA:  Shortness of breath, difficulty breathing EXAM: PORTABLE CHEST 1 VIEW COMPARISON:  05/07/2013 FINDINGS: Lungs  are clear.  No pleural effusion or pneumothorax. The heart is normal size. IMPRESSION: No evidence of acute cardiopulmonary disease. Electronically Signed   By: Charline Bills M.D.   On: 02/27/2016 10:20    EKG:   Normal sinus rhythm 82 bpm, PVC, right bundle branch block  IMPRESSION AND PLAN:   1.  Acute respiratory failure with hypoxia. Pulse ox 85% on room air documented. Continue BiPAP until patient able to move better air. Flu swab ordered. 2. Asthma exacerbation. IV Solu-Medrol 60 mg IV every 6 hours. DuoNeb nebulizer solution and budesonide nebulizer. Zithromax ordered. Sputum culture if able. 3. Acute cystitis with hematuria. Rocephin ordered. And on urine culture to previously collected urine 4. Essential hypertension continue her usual medication. 5. Hyperlipidemia unspecified continue atorvastatin 6. GERD on PPI as outpatient and will continue 7. Allergic rhinitis continue Flonase 8. Hypokalemia replace potassium 9. Leukocytosis. Treat with antibiotics for now.  All the records are reviewed and case discussed with ED provider. Management plans discussed with the patient, family and they are in agreement.  CODE STATUS: Full code  TOTAL TIME TAKING CARE OF THIS PATIENT: 50 minutes. Patient will be admitted to the CCU stepdown because the patient is continuously on BiPAP at this point. Since the patient is not moving much air we have to watch her respiratory status closely. Patient is critically ill.   Alford Highland M.D on 02/27/2016 at 12:15 PM  Between 7am to 6pm - Pager - [phone]  After 6pm call admission pager [phone]  [facility] Office  [phone]  CC: Primary care physician; WHITE, Arlyss RepressELIZABETH BURNEY, NP

## 2016-02-27 NOTE — Therapy (Signed)
Patient educated on the flutter valve. Good technique.

## 2016-02-28 LAB — BASIC METABOLIC PANEL
Anion gap: 9 (ref 5–15)
BUN: 21 mg/dL — ABNORMAL HIGH (ref 6–20)
CALCIUM: 9.5 mg/dL (ref 8.9–10.3)
CO2: 29 mmol/L (ref 22–32)
CREATININE: 0.7 mg/dL (ref 0.44–1.00)
Chloride: 97 mmol/L — ABNORMAL LOW (ref 101–111)
GLUCOSE: 114 mg/dL — AB (ref 65–99)
Potassium: 3.7 mmol/L (ref 3.5–5.1)
Sodium: 135 mmol/L (ref 135–145)

## 2016-02-28 LAB — CBC
HCT: 40 % (ref 35.0–47.0)
Hemoglobin: 13.9 g/dL (ref 12.0–16.0)
MCH: 29 pg (ref 26.0–34.0)
MCHC: 34.7 g/dL (ref 32.0–36.0)
MCV: 83.7 fL (ref 80.0–100.0)
PLATELETS: 451 10*3/uL — AB (ref 150–440)
RBC: 4.78 MIL/uL (ref 3.80–5.20)
RDW: 13.7 % (ref 11.5–14.5)
WBC: 16.3 10*3/uL — ABNORMAL HIGH (ref 3.6–11.0)

## 2016-02-28 LAB — PROCALCITONIN

## 2016-02-28 MED ORDER — DIPHENHYDRAMINE HCL 25 MG PO CAPS
ORAL_CAPSULE | ORAL | Status: AC
  Administered 2016-02-28: 25 mg via ORAL
  Filled 2016-02-28: qty 1

## 2016-02-28 MED ORDER — DIPHENHYDRAMINE HCL 25 MG PO CAPS
25.0000 mg | ORAL_CAPSULE | Freq: Once | ORAL | Status: AC
  Administered 2016-02-28: 25 mg via ORAL

## 2016-02-28 NOTE — Progress Notes (Signed)
[facility] - [place] at [facility]   PATIENT NAME: Deborah Washington    MR#:  [id number]  DATE OF BIRTH:  06/07/1955  SUBJECTIVE:  CHIEF COMPLAINT:   Chief Complaint  Patient presents with  . Shortness of Breath    Came with acute asthma exacerbation and was requiring BiPAP yesterday evening. Improved with treatment, currently on nasal cannula oxygen supplementation.  REVIEW OF SYSTEMS:  CONSTITUTIONAL: No fever, fatigue or weakness.  EYES: No blurred or double vision.  EARS, NOSE, AND THROAT: No tinnitus or ear pain.  RESPIRATORY: No cough, shortness of breath, wheezing or hemoptysis.  CARDIOVASCULAR: No chest pain, orthopnea, edema.  GASTROINTESTINAL: No nausea, vomiting, diarrhea or abdominal pain.  GENITOURINARY: No dysuria, hematuria.  ENDOCRINE: No polyuria, nocturia,  HEMATOLOGY: No anemia, easy bruising or bleeding SKIN: No rash or lesion. MUSCULOSKELETAL: No joint pain or arthritis.   NEUROLOGIC: No tingling, numbness, weakness.  PSYCHIATRY: No anxiety or depression.   ROS  DRUG ALLERGIES:  No Known Allergies  VITALS:  Blood pressure (!) 118/58, pulse 86, temperature 98.4 F (36.9 C), temperature source Oral, resp. rate (!) 26, height 5\' 4"  (1.626 m), weight 77 kg (169 lb 12.1 oz), SpO2 92 %.  PHYSICAL EXAMINATION:  GENERAL:  60 y.o.-year-old patient lying in the bed with no acute distress.  EYES: Pupils equal, round, reactive to light and accommodation. No scleral icterus. Extraocular muscles intact.  HEENT: Head atraumatic, normocephalic. Oropharynx and nasopharynx clear.  NECK:  Supple, no jugular venous distention. No thyroid enlargement, no tenderness.  LUNGS: Normal breath sounds bilaterally, some wheezing,no crepitation. No use of accessory muscles of respiration.  CARDIOVASCULAR: S1, S2 normal. No murmurs, rubs, or gallops.  ABDOMEN: Soft, nontender, nondistended. Bowel sounds present. No organomegaly or mass.  EXTREMITIES: No pedal  edema, cyanosis, or clubbing.  NEUROLOGIC: Cranial nerves II through XII are intact. Muscle strength 5/5 in all extremities. Sensation intact. Gait not checked.  PSYCHIATRIC: The patient is alert and oriented x 3.  SKIN: No obvious rash, lesion, or ulcer.   Physical Exam LABORATORY PANEL:   CBC  Recent Labs Lab 02/28/16 0623  WBC 16.3*  HGB 13.9  HCT 40.0  PLT 451*   ------------------------------------------------------------------------------------------------------------------  Chemistries   Recent Labs Lab 02/27/16 0948 02/28/16 0623  NA 134* 135  K 3.2* 3.7  CL 94* 97*  CO2 27 29  GLUCOSE 119* 114*  BUN 15 21*  CREATININE 0.63 0.70  CALCIUM 9.6 9.5  MG 2.2  --   AST 24  --   ALT 21  --   ALKPHOS 68  --   BILITOT 1.2  --    ------------------------------------------------------------------------------------------------------------------  Cardiac Enzymes  Recent Labs Lab 02/27/16 0948  TROPONINI <0.03   ------------------------------------------------------------------------------------------------------------------  RADIOLOGY:  Dg Chest Portable 1 View  Result Date: 02/27/2016 CLINICAL DATA:  Shortness of breath, difficulty breathing EXAM: PORTABLE CHEST 1 VIEW COMPARISON:  05/07/2013 FINDINGS: Lungs are clear.  No pleural effusion or pneumothorax. The heart is normal size. IMPRESSION: No evidence of acute cardiopulmonary disease. Electronically Signed   By: Charline BillsSriyesh  Krishnan M.D.   On: 02/27/2016 10:20    ASSESSMENT AND PLAN:   Active Problems:   Acute respiratory failure with hypoxemia (HCC)   Community acquired pneumonia   Mild intermittent asthma with acute exacerbation   Hypokalemia  1.  Acute respiratory failure with hypoxia. Pulse ox 85% on room air documented.    Given bipap initially, now off. On nasal canula.  2. Asthma exacerbation. IV  Solu-Medrol 60 mg IV every 6 hours. DuoNeb nebulizer solution and budesonide nebulizer. Zithromax  ordered.  MRSA screen is negative and Procalcitonin is low, so stop Abx.   Appreciated pulm help. 3. Acute cystitis with hematuria. Rocephin ordered. And on urine culture to previously collected urine 4. Essential hypertension continue her usual medication. 5. Hyperlipidemia unspecified continue atorvastatin. 6. GERD on PPI as outpatient and will continue 7. Allergic rhinitis continue Flonase 8. Hypokalemia replace potassium 9. Leukocytosis. Due to acute stress.    All the records are reviewed and case discussed with Care Management/Social Workerr. Management plans discussed with the patient, family and they are in agreement.  CODE STATUS: full.  TOTAL TIME TAKING CARE OF THIS PATIENT: 35 minutes.   Discussed with her husband and daughter in room.  POSSIBLE D/C IN 1-2 DAYS, DEPENDING ON CLINICAL CONDITION.   Altamese Dilling M.D on 02/28/2016   Between 7am to 6pm - Pager - [phone]  After 6pm go to [email or web address] - Social research officer, government  [facility]  Office  [phone]  CC: Primary care physician; WHITE, Arlyss Repress, NP  Note: This dictation was prepared with Dragon dictation along with smaller phrase technology. Any transcriptional errors that result from this process are unintentional.

## 2016-02-28 NOTE — Progress Notes (Signed)
PULMONARY / CRITICAL CARE MEDICINE   Name: Deborah Washington MRN: [id number] DOB: Mar 17, 1956    ADMISSION DATE:  02/27/2016  BRIEF HISTORY: 60 yo female with PMHx of Asthma (mild-intermittent, only on rescue inhaler), GERD, HLD, HTN, seen in consultation for asthma exacerbation. Per patient and daughter sitting at the bedside. She states that over the last 2 weeks she has had ongoing shortness of breath, acutely got worse overnight, and now with whitish to yellow sputum. She admits to wheezing and some intermittent chest pain along with cough. She has been seeing a nurse practitioner in the outpatient over the last week or so, review of records show that she was given a prescription for prednisone and Qvar yesterday, however has not had a chance to fill it. She was admitted by the hospitalist service however upon transport, she noted to be descending down to the upper 80s and was placed on BiPAP, at which point pulmonary was consulted. Patient got 1 g of mag in the ER and also 125 mg of Solu-Medrol as part of her initial workup, flu PCR is pending. As for her asthma history, she was diagnosed about 3 years ago, has mild intermittent asthma on requiring a rescue inhaler, triggers are rapid weather changes, Spring, and more asthma type symptoms during the fall, daughter states that when she does have asthma exacerbations in the past that usually associated with either a bronchitis or some type of pneumonia. I have reviewed her chest x-ray today, there are no infiltrates to suggest a pneumonia.  SUBJECTIVE:  Doing well this morning, weaned of bipap and on 2-3 L supplemental o2, stable to transfer to medsurg unit.   VITAL SIGNS: Temp:  [98.3 F (36.8 C)-98.5 F (36.9 C)] 98.4 F (36.9 C) (10/19 1145) Pulse Rate:  [67-114] 86 (10/19 1145) Resp:  [17-30] 26 (10/19 1145) BP: (118-163)/(56-104) 118/58 (10/19 1145) SpO2:  [88 %-97 %] 92 % (10/19 1405) Weight:  [169 lb 12.1 oz (77 kg)] 169 lb 12.1  oz (77 kg) (10/19 0437) HEMODYNAMICS:   VENTILATOR SETTINGS:   INTAKE / OUTPUT:  Intake/Output Summary (Last 24 hours) at 02/28/16 1550 Last data filed at 02/28/16 0000  Gross per 24 hour  Intake              600 ml  Output              675 ml  Net              -75 ml    Review of Systems  Constitutional: Negative for chills and fever.  HENT: Negative for hearing loss.   Eyes: Negative for blurred vision and double vision.  Respiratory: Positive for cough, sputum production and shortness of breath. Negative for hemoptysis and wheezing.   Cardiovascular: Negative for chest pain.  Gastrointestinal: Negative for heartburn and nausea.  Genitourinary: Negative for dysuria.  Musculoskeletal: Negative for myalgias.  Skin: Negative for rash.  Neurological: Positive for headaches. Negative for dizziness.  Endo/Heme/Allergies: Does not bruise/bleed easily.  Psychiatric/Behavioral: Negative for depression.    Physical Exam  Constitutional: She is oriented to person, place, and time and well-developed, well-nourished, and in no distress.  HENT:  Head: Normocephalic and atraumatic.  Right Ear: External ear normal.  Left Ear: External ear normal.  Eyes: Conjunctivae and EOM are normal. Pupils are equal, round, and reactive to light.  Neck: Normal range of motion. Neck supple.  Cardiovascular: Normal rate, regular rhythm and normal heart sounds.   Pulmonary/Chest: No  respiratory distress.  Mild exp wheezes, coarse upper airway sounds, dec basilar BS  Abdominal: She exhibits no distension. There is no tenderness. There is no rebound and no guarding.  Musculoskeletal: Normal range of motion. She exhibits no edema.  Neurological: She is alert and oriented to person, place, and time.  Skin: Skin is warm and dry.  Nursing note and vitals reviewed.    LABS:  CBC  Recent Labs Lab 02/27/16 0948 02/28/16 0623  WBC 17.4* 16.3*  HGB 14.7 13.9  HCT 42.2 40.0  PLT 374 451*    Coag's No results for input(s): APTT, INR in the last 168 hours. BMET  Recent Labs Lab 02/27/16 0948 02/28/16 0623  NA 134* 135  K 3.2* 3.7  CL 94* 97*  CO2 27 29  BUN 15 21*  CREATININE 0.63 0.70  GLUCOSE 119* 114*   Electrolytes  Recent Labs Lab 02/27/16 0948 02/28/16 0623  CALCIUM 9.6 9.5  MG 2.2  --    Sepsis Markers  Recent Labs Lab 02/28/16 0636  PROCALCITON <0.10   ABG No results for input(s): PHART, PCO2ART, PO2ART in the last 168 hours. Liver Enzymes  Recent Labs Lab 02/27/16 0948  AST 24  ALT 21  ALKPHOS 68  BILITOT 1.2  ALBUMIN 4.5   Cardiac Enzymes  Recent Labs Lab 02/27/16 0948  TROPONINI <0.03   Glucose  Recent Labs Lab 02/27/16 1408  GLUCAP 143*    Imaging No results found.  LINES:   CULTURES: Sputum 10/18>  ANTIBIOTICS Rocephin 10/18> Azithro 10/18>   ASSESSMENT / PLAN: A:60 yo F history of mild intermittent asthma, now with asthma exacerbation secondary to environmental changes on intermittent Bipap, now weaned off, stable to transfer to floor care.   Asthma Asthma exacerbation Hyperlipidemia Hypertension Hypokalemia   P:  -KeepO2 sats greater than 90%, may use supplemental O2 -Continue with steroids, bronchodilators, etc. spirometer, flutter valve -Continue with CAP treatment, may use Rocephin 3 days, in addition to Z-Pak -Start prednisone 40 mg, taper over 10 days, Pulmicort twice a day 3 days, upon discharge to resume Qvar 40 g 1 puff twice a day -Continue with Flonase -Continue with potassium replacement PRN -Continue Tessalon   Thank you for consulting [facility], Please feel free to contacts us with any questions at [phone] (please enter 7-digits).  Pulmonary Consult time - 35 mins    Stephanie AcreVishal Shealeigh Dunstan, MD [facility] Pager [phone]- [phone] (please enter 7-digits) On Call Pager - [phone] (please enter 7-digits)  Note: This note  was prepared with Dragon dictation along with smaller phrase technology. Any transcriptional errors that result from this process are unintentional.

## 2016-02-28 NOTE — Progress Notes (Signed)
[facility]  [facility] transported to room 115 via bed.

## 2016-02-29 MED ORDER — GUAIFENESIN ER 600 MG PO TB12
600.0000 mg | ORAL_TABLET | Freq: Two times a day (BID) | ORAL | Status: DC
  Administered 2016-02-29 – 2016-03-01 (×3): 600 mg via ORAL
  Filled 2016-02-29 (×3): qty 1

## 2016-02-29 MED ORDER — DEXTROMETHORPHAN POLISTIREX ER 30 MG/5ML PO SUER
30.0000 mg | Freq: Two times a day (BID) | ORAL | Status: DC
  Administered 2016-02-29 – 2016-03-01 (×3): 30 mg via ORAL
  Filled 2016-02-29 (×4): qty 5

## 2016-02-29 MED ORDER — IPRATROPIUM-ALBUTEROL 0.5-2.5 (3) MG/3ML IN SOLN
3.0000 mL | Freq: Three times a day (TID) | RESPIRATORY_TRACT | Status: DC
  Administered 2016-02-29 – 2016-03-01 (×3): 3 mL via RESPIRATORY_TRACT
  Filled 2016-02-29 (×3): qty 3

## 2016-02-29 MED ORDER — DM-GUAIFENESIN ER 30-600 MG PO TB12
1.0000 | ORAL_TABLET | Freq: Two times a day (BID) | ORAL | Status: DC
  Filled 2016-02-29 (×2): qty 1

## 2016-02-29 NOTE — Progress Notes (Signed)
Pt ambulated from room and walked around the nurses station on room air. Upon walking patient O2 was 90%. While she ambulated her oxygen level started to destat. During ambulation the lowest o2 saturation was around 82%. She did not show any signs of distress. Her oxygen went back up to 90% within a minute after ambulation. Patient is currently weaned off of 3 L and is on 1 L and stating in the 90s .

## 2016-02-29 NOTE — Progress Notes (Signed)
While rounding, CH made initial visit in room 115. Family was bedside. Pt did not speak english, and so spoke with her as son interpreted. Pt was appreciative of the visit and welcomed prayer, but did not want it in the room. I was asked to pray for her in the [place] and will comply. CH is available for follow up as needed.    02/29/16 1200  Clinical Encounter Type  Visited With Patient and family together  Visit Type Initial;Spiritual support  Referral From Nurse  Spiritual Encounters  Spiritual Needs Prayer

## 2016-02-29 NOTE — Progress Notes (Signed)
[employer] [facility] - [place] at [facility]   PATIENT NAME: Deborah Washington    MR#:  [id number]  DATE OF BIRTH:  [birth year]  SUBJECTIVE:  CHIEF COMPLAINT:   Chief Complaint  Patient presents with  . Shortness of Breath    Came with acute asthma exacerbation and was requiring BiPAP yesterday evening. Improved with treatment, currently on nasal cannula oxygen supplementation.   Tried to taper oxygen- still desaturated.  REVIEW OF SYSTEMS:  CONSTITUTIONAL: No fever, fatigue or weakness.  EYES: No blurred or double vision.  EARS, NOSE, AND THROAT: No tinnitus or ear pain.  RESPIRATORY: No cough, shortness of breath, wheezing or hemoptysis.  CARDIOVASCULAR: No chest pain, orthopnea, edema.  GASTROINTESTINAL: No nausea, vomiting, diarrhea or abdominal pain.  GENITOURINARY: No dysuria, hematuria.  ENDOCRINE: No polyuria, nocturia,  HEMATOLOGY: No anemia, easy bruising or bleeding SKIN: No rash or lesion. MUSCULOSKELETAL: No joint pain or arthritis.   NEUROLOGIC: No tingling, numbness, weakness.  PSYCHIATRY: No anxiety or depression.   ROS  DRUG ALLERGIES:  No Known Allergies  VITALS:  Blood pressure (!) 117/56, pulse 71, temperature 98.3 F (36.8 C), temperature source Oral, resp. rate 17, height 5\' 4"  (1.626 m), weight 77 kg (169 lb 12.1 oz), SpO2 92 %.  PHYSICAL EXAMINATION:  GENERAL:  60 y.o.-year-old patient lying in the bed with no acute distress.  EYES: Pupils equal, round, reactive to light and accommodation. No scleral icterus. Extraocular muscles intact.  HEENT: Head atraumatic, normocephalic. Oropharynx and nasopharynx clear.  NECK:  Supple, no jugular venous distention. No thyroid enlargement, no tenderness.  LUNGS: Normal breath sounds bilaterally, some wheezing,no crepitation. No use of accessory muscles of respiration.  CARDIOVASCULAR: S1, S2 normal. No murmurs, rubs, or gallops.  ABDOMEN: Soft, nontender, nondistended. Bowel sounds present. No  organomegaly or mass.  EXTREMITIES: No pedal edema, cyanosis, or clubbing.  NEUROLOGIC: Cranial nerves II through XII are intact. Muscle strength 5/5 in all extremities. Sensation intact. Gait not checked.  PSYCHIATRIC: The patient is alert and oriented x 3.  SKIN: No obvious rash, lesion, or ulcer.   Physical Exam LABORATORY PANEL:   CBC  Recent Labs Lab 02/28/16 0623  WBC 16.3*  HGB 13.9  HCT 40.0  PLT 451*   ------------------------------------------------------------------------------------------------------------------  Chemistries   Recent Labs Lab 02/27/16 0948 02/28/16 0623  NA 134* 135  K 3.2* 3.7  CL 94* 97*  CO2 27 29  GLUCOSE 119* 114*  BUN 15 21*  CREATININE 0.63 0.70  CALCIUM 9.6 9.5  MG 2.2  --   AST 24  --   ALT 21  --   ALKPHOS 68  --   BILITOT 1.2  --    ------------------------------------------------------------------------------------------------------------------  Cardiac Enzymes  Recent Labs Lab 02/27/16 0948  TROPONINI <0.03   ------------------------------------------------------------------------------------------------------------------  RADIOLOGY:  No results found.  ASSESSMENT AND PLAN:   Active Problems:   Acute respiratory failure with hypoxemia (HCC)   Community acquired pneumonia   Mild intermittent asthma with acute exacerbation   Hypokalemia  1.  Acute respiratory failure with hypoxia. Pulse ox 85% on room air documented.    Given bipap initially, now off. On nasal canula.    Could not take her off of oxygen. 2. Asthma exacerbation. IV Solu-Medrol 60 mg IV every 6 hours. DuoNeb nebulizer solution and budesonide nebulizer. Zithromax ordered.  MRSA screen is negative and Procalcitonin is low, so stop Abx.   Appreciated pulm help.   Switched to oral steroids. 3. Acute cystitis with hematuria.  This was a mistake, that was seen in old urinalysis on this admission.   Not present on this admission. 4. Essential  hypertension continue her usual medication. 5. Hyperlipidemia unspecified continue atorvastatin. 6. GERD on PPI as outpatient and will continue 7. Allergic rhinitis continue Flonase 8. Hypokalemia replace potassium 9. Leukocytosis. Due to acute stress.    All the records are reviewed and case discussed with Care Management/Social Workerr. Management plans discussed with the patient, family and they are in agreement.  CODE STATUS: full.  TOTAL TIME TAKING CARE OF THIS PATIENT: 35 minutes.   Discussed with her husband and daughter in room.  POSSIBLE D/C IN 1-2 DAYS, DEPENDING ON CLINICAL CONDITION.   Altamese DillingVACHHANI, Maevyn Riordan M.D on 02/29/2016   Between 7am to 6pm - Pager - [phone]  After 6pm go to [email or web address] - Social research officer, governmentpassword EPAS [facility]  [facility]  Office  [phone]  CC: Primary care physician; WHITE, Arlyss RepressELIZABETH BURNEY, NP  Note: This dictation was prepared with Dragon dictation along with smaller phrase technology. Any transcriptional errors that result from this process are unintentional.

## 2016-03-01 LAB — CULTURE, RESPIRATORY W GRAM STAIN: Culture: NORMAL

## 2016-03-01 LAB — URINE CULTURE: CULTURE: NO GROWTH

## 2016-03-01 LAB — CULTURE, RESPIRATORY

## 2016-03-01 LAB — PROCALCITONIN: Procalcitonin: <0.1 ng/mL

## 2016-03-01 MED ORDER — PREDNISONE 20 MG PO TABS: ORAL_TABLET | ORAL | Status: DC

## 2016-03-01 NOTE — Progress Notes (Signed)
[facility] - [place] at [facility]        [name] was admitted to the Hospital on 02/27/2016 and Discharged  03/01/2016 and should be excused from work/school   for 14  days starting 02/27/2016 , follow up PCP for reevaluation to decide whether to return to work. Shaune Pollackhen, [name] M.D on 03/01/2016,at 9:50 AM  [facility] - [place] at [facility]    Office  [phone]

## 2016-03-01 NOTE — Progress Notes (Signed)
   Patient Saturations on Room Air at Rest = 90%  Patient Saturations on Room Air while Ambulating = 88%    

## 2016-03-01 NOTE — Discharge Summary (Signed)
[facility] - [place] at [facility]   PATIENT NAME: Deborah Washington    MR#:  [id number]  DATE OF BIRTH:  1955-08-11  DATE OF ADMISSION:  02/27/2016   ADMITTING PHYSICIAN: Alford Highland, MD  DATE OF DISCHARGE: 03/01/2016  PRIMARY CARE PHYSICIAN: WHITE, Arlyss Repress, NP   ADMISSION DIAGNOSIS:  COPD exacerbation (HCC) [J44.1] Acute respiratory failure with hypoxemia (HCC) [J96.01] Community acquired pneumonia, unspecified laterality [J18.9] DISCHARGE DIAGNOSIS:  Active Problems:   Acute respiratory failure with hypoxemia (HCC)   Community acquired pneumonia   Mild intermittent asthma with acute exacerbation   Hypokalemia  SECONDARY DIAGNOSIS:   Past Medical History:  Diagnosis Date  . Asthma   . GERD (gastroesophageal reflux disease)   . Hyperlipidemia   . Hypertension    HOSPITAL COURSE:   Acute respiratory failure with hypoxemia (HCC)   Community acquired pneumonia   Mild intermittent asthma with acute exacerbation   Hypokalemia  1. Acute respiratory failure with hypoxia. Pulse ox 88% on exertion in room air.    Given bipap initially, now off. On nasal canula.    Could not take her off of oxygen. She needs home O2 [place] 1-2 L.  2. Asthma exacerbation. She has been treated with IV Solu-Medrol 60 mg IV every 6 hours. Changed prednisone po. DuoNeb nebulizer solution and budesonide nebulizer. Zithromax ordered.  MRSA screen is negative and Procalcitonin is low, so stop Abx.   Appreciated pulm help.  3. Acute cystitis with hematuria.    This was a mistake, that was seen in old urinalysis on this admission.   Not present on this admission. 4. Essential hypertension continue her usual medication. 5. Hyperlipidemia unspecified continue atorvastatin. 6. GERD on PPI as outpatient and will continue 7. Allergic rhinitis continue Flonase 8. Hypokalemia replaced potassium, improved. 9. Leukocytosis. Due to acute stress.  DISCHARGE CONDITIONS:    Stable, discharge to home with O2 [place] 1-2 L. CONSULTS OBTAINED:  Treatment Team:  Stephanie Acre, MD DRUG ALLERGIES:  No Known Allergies DISCHARGE MEDICATIONS:     Medication List    TAKE these medications   albuterol 1.25 MG/3ML nebulizer solution Commonly known as:  ACCUNEB Inhale 3 mLs into the lungs every 6 (six) hours as needed.   albuterol 108 (90 Base) MCG/ACT inhaler Commonly known as:  PROVENTIL HFA;VENTOLIN HFA Inhale into the lungs.   atorvastatin 20 MG tablet Commonly known as:  LIPITOR Take 20 mg by mouth daily.   beclomethasone 40 MCG/ACT inhaler Commonly known as:  QVAR Inhale 1 puff into the lungs 2 (two) times daily.   benzonatate 200 MG capsule Commonly known as:  TESSALON Take 200 mg by mouth 3 (three) times daily.   fluticasone 50 MCG/ACT nasal spray Commonly known as:  FLONASE Place 2 sprays into the nose daily.   lisinopril-hydrochlorothiazide 20-25 MG tablet Commonly known as:  PRINZIDE,ZESTORETIC Take 1 tablet by mouth daily.   omeprazole 20 MG capsule Commonly known as:  PRILOSEC 20 mg daily.   predniSONE 20 MG tablet Commonly known as:  DELTASONE 40 mg po daily for 2 days, 20 mg po daily for 2 days, 10 mg po daily for 3 days. What changed:  how much to take  how to take this  additional instructions        DISCHARGE INSTRUCTIONS:   DIET:  Heart healthy diet. DISCHARGE CONDITION:  Stable. ACTIVITY:  As tolerated. OXYGEN:  Home Oxygen:  [place] 1-2 L. Oxygen Delivery: yes DISCHARGE LOCATION:  Home.  If  you experience worsening of your admission symptoms, develop shortness of breath, life threatening emergency, suicidal or homicidal thoughts you must seek medical attention immediately by calling 911 or calling your MD immediately  if symptoms less severe.  You Must read complete instructions/literature along with all the possible adverse reactions/side effects for all the Medicines you take and that have been prescribed to  you. Take any new Medicines after you have completely understood and accpet all the possible adverse reactions/side effects.   Please note  You were cared for by a hospitalist during your hospital stay. If you have any questions about your discharge medications or the care you received while you were in the hospital after you are discharged, you can call the unit and asked to speak with the hospitalist on call if the hospitalist that took care of you is not available. Once you are discharged, your primary care physician will handle any further medical issues. Please note that NO REFILLS for any discharge medications will be authorized once you are discharged, as it is imperative that you return to your primary care physician (or establish a relationship with a primary care physician if you do not have one) for your aftercare needs so that they can reassess your need for medications and monitor your lab values.    On the day of Discharge:  VITAL SIGNS:  Blood pressure (!) 116/56, pulse 88, temperature 98.3 F (36.8 C), temperature source Oral, resp. rate 20, height 5\' 4"  (1.626 m), weight 169 lb 12.1 oz (77 kg), SpO2 90 %. PHYSICAL EXAMINATION:  GENERAL:  60 y.o.-year-old patient lying in the bed with no acute distress.  EYES: Pupils equal, round, reactive to light and accommodation. No scleral icterus. Extraocular muscles intact.  HEENT: Head atraumatic, normocephalic. Oropharynx and nasopharynx clear.  NECK:  Supple, no jugular venous distention. No thyroid enlargement, no tenderness.  LUNGS: Normal breath sounds bilaterally, no wheezing, rales,rhonchi or crepitation. No use of accessory muscles of respiration.  CARDIOVASCULAR: S1, S2 normal. No murmurs, rubs, or gallops.  ABDOMEN: Soft, non-tender, non-distended. Bowel sounds present. No organomegaly or mass.  EXTREMITIES: No pedal edema, cyanosis, or clubbing.  NEUROLOGIC: Cranial nerves II through XII are intact. Muscle strength 5/5 in all  extremities. Sensation intact. Gait not checked.  PSYCHIATRIC: The patient is alert and oriented x 3.  SKIN: No obvious rash, lesion, or ulcer.  DATA REVIEW:   CBC  Recent Labs Lab 02/28/16 0623  WBC 16.3*  HGB 13.9  HCT 40.0  PLT 451*    Chemistries   Recent Labs Lab 02/27/16 0948 02/28/16 0623  NA 134* 135  K 3.2* 3.7  CL 94* 97*  CO2 27 29  GLUCOSE 119* 114*  BUN 15 21*  CREATININE 0.63 0.70  CALCIUM 9.6 9.5  MG 2.2  --   AST 24  --   ALT 21  --   ALKPHOS 68  --   BILITOT 1.2  --      Microbiology Results  Results for orders placed or performed during the hospital encounter of 02/27/16  MRSA PCR Screening     Status: None   Collection Time: 02/27/16  2:10 PM  Result Value Ref Range Status   MRSA by PCR NEGATIVE NEGATIVE Final    Comment:        The GeneXpert MRSA Assay (FDA approved for NASAL specimens only), is one component of a comprehensive MRSA colonization surveillance program. It is not intended to diagnose MRSA infection nor to guide or  monitor treatment for MRSA infections.   Culture, expectorated sputum-assessment     Status: None   Collection Time: 02/27/16  5:40 PM  Result Value Ref Range Status   Specimen Description SPUTUM  Final   Special Requests NONE  Final   Sputum evaluation THIS SPECIMEN IS ACCEPTABLE FOR SPUTUM CULTURE  Final   Report Status 02/27/2016 FINAL  Final  Culture, respiratory (NON-Expectorated)     Status: None   Collection Time: 02/27/16  5:40 PM  Result Value Ref Range Status   Specimen Description SPUTUM  Final   Special Requests NONE Reflexed from [id number]  Final   Gram Stain   Final    ABUNDANT WBC PRESENT,BOTH PMN AND MONONUCLEAR ABUNDANT GRAM NEGATIVE RODS ABUNDANT GRAM POSITIVE COCCI IN PAIRS ABUNDANT GRAM POSITIVE RODS FEW SQUAMOUS EPITHELIAL CELLS PRESENT    Culture   Final    Consistent with normal respiratory flora. Performed at [facility]    Report Status 03/01/2016 FINAL  Final    Urine culture     Status: None   Collection Time: 02/29/16  9:07 AM  Result Value Ref Range Status   Specimen Description URINE, RANDOM  Final   Special Requests NONE  Final   Culture NO GROWTH Performed at [facility]   Final   Report Status 03/01/2016 FINAL  Final    RADIOLOGY:  No results found.   Management plans discussed with the patient, her husband and they are in agreement.  CODE STATUS:     Code Status Orders        Start     Ordered   02/27/16 1209  Full code  Continuous     02/27/16 1209    Code Status History    Date Active Date Inactive Code Status Order ID Comments User Context   This patient has a current code status but no historical code status.      TOTAL TIME TAKING CARE OF THIS PATIENT: 35 minutes.    Shaune Pollackhen, Kylen Ismael M.D on 03/01/2016 at 1:49 PM  Between 7am to 6pm - Pager - [phone]  After 6pm go to [email or web address] - Social research officer, governmentpassword EPAS [facility]  [facility] [place] [facility]  Office  [phone]  CC: Primary care physician; WHITE, Arlyss RepressELIZABETH BURNEY, NP   Note: This dictation was prepared with Dragon dictation along with smaller phrase technology. Any transcriptional errors that result from this process are unintentional.

## 2016-03-01 NOTE — Discharge Instructions (Signed)
Heart healthy diet. Activity as tolerated. Need home O2 [place] 1-2 L.

## 2016-03-01 NOTE — Progress Notes (Signed)
Pt being discharged home, discharge instructions reviewed with pt and family, states understanding, pt will discharge with 1-2L [place], states understanding of o2 teaching, pt with no noted complaints, no distress or discomfort noted

## 2016-03-01 NOTE — Progress Notes (Signed)
SATURATION QUALIFICATIONS: (This note is used to comply with regulatory documentation for home oxygen)  Patient Saturations on Room Air at Rest =90%  Patient Saturations on Room Air while Ambulating = 88%  Patient Saturations on 1 Liters of oxygen while Ambulating = 93%  Please briefly explain why patient needs home oxygen:   Asthma O2 sat measurements obtained by Henriette CombsAmanda Rn today.

## 2016-03-01 NOTE — Care Management Note (Signed)
Case Management Note  Patient Details  Name: Deborah Washington MRN: [id number] Date of Birth: 03/09/1956  Subjective/Objective:       Deborah Deborah Washington qualifies for new home oxygen today. A [facility] and for new oxygen set up in Deborah Washington home today was faxed to [facility] at 10:30am today.             Action/Plan:   Expected Discharge Date:                  Expected Discharge Plan:     In-House Referral:     Discharge planning Services     Post Acute Care Choice:    Choice offered to:     DME Arranged:    DME Agency:     HH Arranged:    HH Agency:     Status of Service:     If discussed at [employer] Length of Stay Meetings, dates discussed:    Additional Comments:  Trude Cansler A, RN 03/01/2016, 10:27 AM

## 2016-03-27 NOTE — Progress Notes (Signed)
[facility]* [facility]     Assessment and Plan:  Acute exacerbation of asthma.  -She appears to improved to nearly baseline. -Given the severity of her recent exacerbation. Our goal is to keep her asthma controlled, and to try to prevent future exacerbations. -She has no known triggers, we will continue the patient on Advair HFA 2 puffs twice daily, she is instructed to rinse her mouth after each use. She can also continue albuterol, but she only needs to use it on an as-needed basis.  Acute hypoxic respiratory failure. -This appears to have resolved, we will discontinue daytime and ambulatory oxygen. -The patient is asked to continue nocturnal oxygen until we have performed an overnight oximetry on room air to ensure that she does not require this.  Acute on Chronic rhinitis. -The patient likely has chronic allergic rhinitis, she currently has some nosebleeds due to acute rhinitis, I suspect that this is due to use of oxygen. -Discontinuation of oxygen should help with this.  Excessive daytime sleepiness.  She does have some daytime sleepiness, discussed that we will address this further at her next visit. Hopefully, hopefully she is continuing to do well at that visit.  Patient was given a return to work note without restrictions; return to work date is 04/07/16.   Date: 03/27/2016  MRN# [id number] Deborah Washington 12/01/1955   Deborah Washington is a 60 y.o. old female seen in follow up for chief complaint of  Chief Complaint  Patient presents with  . Hospitalization Follow-up    feels better but has SOB w/activity: pressure in back form neck to mid back; no cough or chest tightness     HPI:   Pt is a 60 y.o. Hispanic female with PMH of HTN, Hyperlipidemia, GERD, and asthma.  Pt [facility]16 with respiratory distress with hypoxia due to AEAsthma and is now following up in our office for hospital follow up regarding asthma, she was  discharged on oxygen, she is currently on 1L.   She was using qvar but is no longer taking it.  Currently she feels that her breathing is back to normal. She is currently using advair hfa 230 2 puffs bid, not rinsing mouth. She is using albuterol nebs about twice per day, and feels that it is helping.  She does not smoke, she never smoked nor lived with a smoker. 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 Her asthma started about 2-3 years ago, her asthma symptoms started about 6 months ago.   She has no pets, no new furniture, no carpets. She has never been testing for allergies. She does have seasonal allergies which vary, she sometimes takes Careers adviserallegra. She has been on inhalers in the past.   She does snore at night, her husband is present and gives this history. She occasionally stops breathing at night. She is sleepy during the day and will sometimes take a nap.   Medication:   Outpatient Encounter Prescriptions as of 03/28/2016  Medication Sig  . albuterol (ACCUNEB) 1.25 MG/3ML nebulizer solution Inhale 3 mLs into the lungs every 6 (six) hours as needed.   Marland Kitchen. albuterol (PROVENTIL HFA;VENTOLIN HFA) 108 (90 Base) MCG/ACT inhaler Inhale into the lungs.  Marland Kitchen. atorvastatin (LIPITOR) 20 MG tablet Take 20 mg by mouth daily.   . beclomethasone (QVAR) 40 MCG/ACT inhaler Inhale 1 puff into the lungs 2 (two) times daily.   . benzonatate (TESSALON) 200 MG capsule Take 200  mg by mouth 3 (three) times daily.   . fluticasone (FLONASE) 50 MCG/ACT nasal spray Place 2 sprays into the nose daily.   Marland Kitchen. lisinopril-hydrochlorothiazide (PRINZIDE,ZESTORETIC) 20-25 MG tablet Take 1 tablet by mouth daily.   Marland Kitchen. omeprazole (PRILOSEC) 20 MG capsule 20 mg daily.   . predniSONE (DELTASONE) 20 MG tablet 40 mg po daily for 2 days, 20 mg po daily for 2 days, 10 mg po daily for 3 days.   No facility-administered encounter medications on file as of 03/28/2016.      Allergies:  Patient has no  known allergies.  Review of Systems: Gen:  Denies  fever, sweats. HEENT: Denies blurred vision. Cvc:  No dizziness, chest pain or heaviness Resp:   Denies cough or sputum porduction. Gi: Denies swallowing difficulty, stomach pain. constipation, bowel incontinence Gu:  Denies bladder incontinence, burning urine Ext:   No Joint pain, stiffness. Skin: No skin rash, easy bruising. Endoc:  No polyuria, polydipsia. Psych: No depression, insomnia. Other:  All other systems were reviewed and found to be negative other than what is mentioned in the HPI.   Physical Examination:   VS: BP 128/86 (BP Location: Right Arm, Cuff Size: Normal)   Pulse 91   SpO2 100%   General Appearance: No distress  Neuro:without focal findings,  speech normal,  HEENT: PERRLA, EOM intact.Mallampati 3 Pulmonary: normal breath sounds, No wheezing.   CardiovascularNormal S1,S2.  No m/r/g.   Abdomen: Benign, Soft, non-tender. Renal:  No costovertebral tenderness  GU:  Not performed at this time. Endoc: No evident thyromegaly, no signs of acromegaly. Skin:   warm, no rash. Extremities: normal, no cyanosis, clubbing.   LABORATORY PANEL:   CBC No results for input(s): WBC, HGB, HCT, PLT in the last 168 hours. ------------------------------------------------------------------------------------------------------------------  Chemistries  No results for input(s): NA, K, CL, CO2, GLUCOSE, BUN, CREATININE, CALCIUM, MG, AST, ALT, ALKPHOS, BILITOT in the last 168 hours.  Invalid input(s): GFRCGP ------------------------------------------------------------------------------------------------------------------  Cardiac Enzymes No results for input(s): TROPONINI in the last 168 hours. ------------------------------------------------------------  RADIOLOGY:   No results found for this or any previous visit. Results for orders placed in visit on 05/07/13  DG Chest 2 View   Narrative * PRIOR REPORT IMPORTED FROM AN  EXTERNAL SYSTEM *   CLINICAL DATA:  Chest pain and cough   EXAM:  CHEST  2 VIEW   COMPARISON:  Report of prior chest radiograph February 16, 2004  available; images from that study are not available.   FINDINGS:  Lungs are clear. The heart is upper normal in size with normal  pulmonary vascularity. No adenopathy. No bone lesions. There is  atherosclerotic change in the aortic arch region.   IMPRESSION:  No edema or consolidation.    Electronically Signed    By: Bretta BangWilliam  Woodruff M.D.    On: 05/07/2013 09:52       ------------------------------------------------------------------------------------------------------------------  Thank  you for allowing [facility] to assist in the care of your patient. Our recommendations are noted above.  Please contact us if we can be of further service.   Wells Guileseep Franci Oshana, MD.  [place] [facility] [facility] Office Number: [phone]  Santiago Gladavid Kasa, M.D.  Stephanie AcreVishal Mungal, M.D.  Billy Fischeravid Simonds, M.D  03/27/2016

## 2016-03-28 ENCOUNTER — Ambulatory Visit (INDEPENDENT_AMBULATORY_CARE_PROVIDER_SITE_OTHER): Payer: BLUE CROSS/BLUE SHIELD | Admitting: Internal Medicine

## 2016-03-28 ENCOUNTER — Encounter: Payer: Self-pay | Admitting: Internal Medicine

## 2016-03-28 ENCOUNTER — Encounter: Payer: Self-pay | Admitting: *Deleted

## 2016-03-28 VITALS — BP 128/86 | HR 91

## 2016-03-28 DIAGNOSIS — J302 Other seasonal allergic rhinitis: Secondary | ICD-10-CM | POA: Diagnosis not present

## 2016-03-28 DIAGNOSIS — J4541 Moderate persistent asthma with (acute) exacerbation: Secondary | ICD-10-CM | POA: Diagnosis not present

## 2016-03-28 DIAGNOSIS — G4719 Other hypersomnia: Secondary | ICD-10-CM | POA: Diagnosis not present

## 2016-03-28 NOTE — Patient Instructions (Addendum)
--  Descontinuar oxygeno  --Surveyor, quantityContinuar advair, dos veces al dia. Enjuagarse cada vez que huce.   --Usar albuterol cuando lo nesecito.   --Continuar oxygeno en el noche para dormir.

## 2016-07-07 ENCOUNTER — Ambulatory Visit: Payer: BLUE CROSS/BLUE SHIELD | Admitting: Internal Medicine

## 2016-07-19 NOTE — Progress Notes (Deleted)
[facility] [place] [facility]     Assessment and Plan:  Acute exacerbation of asthma.  -She appears to improved to nearly baseline. -Given the severity of her recent exacerbation. Our goal is to keep her asthma controlled, and to try to prevent future exacerbations. -She has no known triggers, we will continue the patient on Advair HFA 2 puffs twice daily, she is instructed to rinse her mouth after each use. She can also continue albuterol, but she only needs to use it on an as-needed basis.  Acute hypoxic respiratory failure. -This appears to have resolved, we will discontinue daytime and ambulatory oxygen. -The patient is asked to continue nocturnal oxygen until we have performed an overnight oximetry on room air to ensure that she does not require this.  Acute on Chronic rhinitis. -The patient likely has chronic allergic rhinitis, she currently has some nosebleeds due to acute rhinitis, I suspect that this is due to use of oxygen. -Discontinuation of oxygen should help with this.  Excessive daytime sleepiness.  She does have some daytime sleepiness, discussed that we will address this further at her next visit. Hopefully, hopefully she is continuing to do well at that visit.  Patient was given a return to work note without restrictions; return to work date is 04/07/16.   Date: 07/19/2016  MRN# [id number] Deborah Washington Deborah Washington 12/10/1955   [place] Silos Deborah Washington is a 61 y.o. old female seen in follow up for chief complaint of  No chief complaint on file.    HPI:   Pt is a 61 y.o. Hispanic female with PMH of HTN, Hyperlipidemia, GERD, and asthma.  At last visit she was asked to continue advair, albuterol, oxygen at night, but her daytime oxygen was discontinued.    She does snore at night, her husband is present and gives this history. She occasionally stops breathing at night. She is sleepy during the day and will sometimes take a nap.   Medication:   Outpatient  Encounter Prescriptions as of 07/21/2016  Medication Sig  . albuterol (ACCUNEB) 1.25 MG/3ML nebulizer solution Inhale 3 mLs into the lungs every 6 (six) hours as needed.   Marland Kitchen albuterol (PROVENTIL HFA;VENTOLIN HFA) 108 (90 Base) MCG/ACT inhaler Inhale into the lungs.  Marland Kitchen atorvastatin (LIPITOR) 20 MG tablet Take 20 mg by mouth daily.   . fluticasone (FLONASE) 50 MCG/ACT nasal spray Place 2 sprays into the nose daily.   . fluticasone-salmeterol (ADVAIR HFA) 230-21 MCG/ACT inhaler Inhale 2 puffs into the lungs 2 (two) times daily.  Marland Kitchen lisinopril-hydrochlorothiazide (PRINZIDE,ZESTORETIC) 20-25 MG tablet Take 1 tablet by mouth daily.   . montelukast (SINGULAIR) 10 MG tablet Take 10 mg by mouth at bedtime.  . Multiple Vitamin (MULTI-VITAMINS) TABS Take 1 tablet by mouth daily.  Marland Kitchen omeprazole (PRILOSEC) 20 MG capsule 20 mg daily.    No facility-administered encounter medications on file as of 07/21/2016.      Allergies:  Patient has no known allergies.  Review of Systems: Gen:  Denies  fever, sweats. HEENT: Denies blurred vision. Cvc:  No dizziness, chest pain or heaviness Resp:   Denies cough or sputum porduction. Gi: Denies swallowing difficulty, stomach pain. constipation, bowel incontinence Gu:  Denies bladder incontinence, burning urine Ext:   No Joint pain, stiffness. Skin: No skin rash, easy bruising. Endoc:  No polyuria, polydipsia. Psych: No depression, insomnia. Other:  All other systems were reviewed and found to be negative other than what is mentioned in the HPI.   Physical Examination:  VS: There were no vitals taken for this visit.  General Appearance: No distress  Neuro:without focal findings,  speech normal,  HEENT: PERRLA, EOM intact.Mallampati 3 Pulmonary: normal breath sounds, No wheezing.   CardiovascularNormal S1,S2.  No m/r/g.   Abdomen: Benign, Soft, non-tender. Renal:  No costovertebral tenderness  GU:  Not performed at this time. Endoc: No evident thyromegaly,  no signs of acromegaly. Skin:   warm, no rash. Extremities: normal, no cyanosis, clubbing.   LABORATORY PANEL:   CBC No results for input(s): WBC, HGB, HCT, PLT in the last 168 hours. ------------------------------------------------------------------------------------------------------------------  Chemistries  No results for input(s): NA, K, CL, CO2, GLUCOSE, BUN, CREATININE, CALCIUM, MG, AST, ALT, ALKPHOS, BILITOT in the last 168 hours.  Invalid input(s): GFRCGP ------------------------------------------------------------------------------------------------------------------  Cardiac Enzymes No results for input(s): TROPONINI in the last 168 hours. ------------------------------------------------------------  RADIOLOGY:   No results found for this or any previous visit. Results for orders placed in visit on 05/07/13  DG Chest 2 View   Narrative * PRIOR REPORT IMPORTED FROM AN EXTERNAL SYSTEM *   CLINICAL DATA:  Chest pain and cough   EXAM:  CHEST  2 VIEW   COMPARISON:  Report of prior chest radiograph February 16, 2004  available; images from that study are not available.   FINDINGS:  Lungs are clear. The heart is upper normal in size with normal  pulmonary vascularity. No adenopathy. No bone lesions. There is  atherosclerotic change in the aortic arch region.   IMPRESSION:  No edema or consolidation.    Electronically Signed    By: Bretta BangWilliam  Woodruff M.D.    On: 05/07/2013 09:52       ------------------------------------------------------------------------------------------------------------------  Thank  you for [facility] to assist in the care of your patient. Our recommendations are noted above.  Please contact us if we can be of further service.   Wells Guileseep Akiva Josey, MD.  [place] [facility] Office Number: [phone]  Santiago Gladavid Kasa, M.D.  Stephanie AcreVishal Mungal, M.D.  Billy Fischeravid Simonds, M.D  07/19/2016

## 2016-07-21 ENCOUNTER — Ambulatory Visit: Payer: BLUE CROSS/BLUE SHIELD | Admitting: Internal Medicine

## 2016-07-23 NOTE — Progress Notes (Deleted)
[facility] [place] [facility]     Assessment and Plan:  Acute exacerbation of asthma.  -She appears to improved to nearly baseline. -Given the severity of her recent exacerbation. Our goal is to keep her asthma controlled, and to try to prevent future exacerbations. -She has no known triggers, we will continue the patient on Advair HFA 2 puffs twice daily, she is instructed to rinse her mouth after each use. She can also continue albuterol, but she only needs to use it on an as-needed basis.  Acute hypoxic respiratory failure. -This appears to have resolved, we will discontinue daytime and ambulatory oxygen. -The patient is asked to continue nocturnal oxygen until we have performed an overnight oximetry on room air to ensure that she does not require this.  Acute on Chronic rhinitis. -The patient likely has chronic allergic rhinitis, she currently has some nosebleeds due to acute rhinitis, I suspect that this is due to use of oxygen. -Discontinuation of oxygen should help with this.  Excessive daytime sleepiness.  She does have some daytime sleepiness, discussed that we will address this further at her next visit. Hopefully, hopefully she is continuing to do well at that visit.  Patient was given a return to work note without restrictions; return to work date is 04/07/16.   Date: 07/23/2016  MRN# [id number] Deborah Washington 60/08/1955   Deborah Washington is a 61 y.o. old female seen in follow up for chief complaint of  No chief complaint on file.    HPI:   Pt is a 61 y.o. Hispanic female with PMH of HTN, Hyperlipidemia, GERD, and asthma.  At last visit she was asked to continue advair, albuterol, oxygen at night, but her daytime oxygen was discontinued.    She does snore at night, her husband is present and gives this history. She occasionally stops breathing at night. She is sleepy during the day and will sometimes take a nap.   Medication:   Outpatient  Encounter Prescriptions as of 07/31/2016  Medication Sig  . albuterol (ACCUNEB) 1.25 MG/3ML nebulizer solution Inhale 3 mLs into the lungs every 6 (six) hours as needed.   Marland Kitchen albuterol (PROVENTIL HFA;VENTOLIN HFA) 108 (90 Base) MCG/ACT inhaler Inhale into the lungs.  Marland Kitchen atorvastatin (LIPITOR) 20 MG tablet Take 20 mg by mouth daily.   . fluticasone (FLONASE) 50 MCG/ACT nasal spray Place 2 sprays into the nose daily.   . fluticasone-salmeterol (ADVAIR HFA) 230-21 MCG/ACT inhaler Inhale 2 puffs into the lungs 2 (two) times daily.  Marland Kitchen lisinopril-hydrochlorothiazide (PRINZIDE,ZESTORETIC) 20-25 MG tablet Take 1 tablet by mouth daily.   . montelukast (SINGULAIR) 10 MG tablet Take 10 mg by mouth at bedtime.  . Multiple Vitamin (MULTI-VITAMINS) TABS Take 1 tablet by mouth daily.  Marland Kitchen omeprazole (PRILOSEC) 20 MG capsule 20 mg daily.    No facility-administered encounter medications on file as of 07/31/2016.      Allergies:  Patient has no known allergies.  Review of Systems: Gen:  Denies  fever, sweats. HEENT: Denies blurred vision. Cvc:  No dizziness, chest pain or heaviness Resp:   Denies cough or sputum porduction. Gi: Denies swallowing difficulty, stomach pain. constipation, bowel incontinence Gu:  Denies bladder incontinence, burning urine Ext:   No Joint pain, stiffness. Skin: No skin rash, easy bruising. Endoc:  No polyuria, polydipsia. Psych: No depression, insomnia. Other:  All other systems were reviewed and found to be negative other than what is mentioned in the HPI.   Physical Examination:  VS: There were no vitals taken for this visit.  General Appearance: No distress  Neuro:without focal findings,  speech normal,  HEENT: PERRLA, EOM intact.Mallampati 3 Pulmonary: normal breath sounds, No wheezing.   CardiovascularNormal S1,S2.  No m/r/g.   Abdomen: Benign, Soft, non-tender. Renal:  No costovertebral tenderness  GU:  Not performed at this time. Endoc: No evident thyromegaly,  no signs of acromegaly. Skin:   warm, no rash. Extremities: normal, no cyanosis, clubbing.   LABORATORY PANEL:   CBC No results for input(s): WBC, HGB, HCT, PLT in the last 168 hours. ------------------------------------------------------------------------------------------------------------------  Chemistries  No results for input(s): NA, K, CL, CO2, GLUCOSE, BUN, CREATININE, CALCIUM, MG, AST, ALT, ALKPHOS, BILITOT in the last 168 hours.  Invalid input(s): GFRCGP ------------------------------------------------------------------------------------------------------------------  Cardiac Enzymes No results for input(s): TROPONINI in the last 168 hours. ------------------------------------------------------------  RADIOLOGY:   No results found for this or any previous visit. Results for orders placed in visit on 05/07/13  DG Chest 2 View   Narrative * PRIOR REPORT IMPORTED FROM AN EXTERNAL SYSTEM *   CLINICAL DATA:  Chest pain and cough   EXAM:  CHEST  2 VIEW   COMPARISON:  Report of prior chest radiograph February 16, 2004  available; images from that study are not available.   FINDINGS:  Lungs are clear. The heart is upper normal in size with normal  pulmonary vascularity. No adenopathy. No bone lesions. There is  atherosclerotic change in the aortic arch region.   IMPRESSION:  No edema or consolidation.    Electronically Signed    By: Bretta BangWilliam  Woodruff M.D.    On: 05/07/2013 09:52       ------------------------------------------------------------------------------------------------------------------  Thank  you for allowing [facility] to assist in the care of your patient. Our recommendations are noted above.  Please contact us if we can be of further service.   Wells Guileseep Harlan Ervine, MD.  [place] [facility] Office Number: [phone]  Santiago Gladavid Kasa, M.D.  Stephanie AcreVishal Mungal, M.D.  Billy Fischeravid Simonds, M.D  07/23/2016

## 2016-07-31 ENCOUNTER — Encounter: Payer: Self-pay | Admitting: Internal Medicine

## 2016-07-31 ENCOUNTER — Ambulatory Visit (INDEPENDENT_AMBULATORY_CARE_PROVIDER_SITE_OTHER): Payer: BLUE CROSS/BLUE SHIELD | Admitting: Internal Medicine

## 2016-07-31 ENCOUNTER — Ambulatory Visit: Payer: BLUE CROSS/BLUE SHIELD | Admitting: Internal Medicine

## 2016-07-31 VITALS — BP 128/88 | HR 88 | Wt 177.0 lb

## 2016-07-31 DIAGNOSIS — J4520 Mild intermittent asthma, uncomplicated: Secondary | ICD-10-CM | POA: Diagnosis not present

## 2016-07-31 MED ORDER — PANTOPRAZOLE SODIUM 40 MG PO TBEC: 40.0000 mg | DELAYED_RELEASE_TABLET | Freq: Every day | ORAL | 11 refills | Status: AC

## 2016-07-31 NOTE — Progress Notes (Signed)
[facility]    Date: 07/31/2016  MRN# [id number] Deborah MerinoMaria T Washington Washington 05/09/1956   Rosaland LaoMaria T Washington Washington is a 61 y.o. old female seen in follow up for chief complaint of  CC follow up ASTHMA  Interval History Pt is a 61 y.o. Hispanic female with PMH of HTN, Hyperlipidemia, GERD, and asthma.  [facility] with respiratory distress with hypoxia due to AEAsthma and is now following up in our office for hospital follow up regarding asthma, she was discharged on oxygen  She was using qvar but is no longer taking it.  Currently she feels that her breathing is back to normal. She is currently using advair hfa 230 2 puffs bid, not rinsing mouth. She is using albuterol nebs about twice per day, and feels that it is helping.  She does not smoke, she never smoked nor lived with a smoker. 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 Her asthma started about 2-3 years ago, her asthma symptoms started about 6 months ago.   She has no pets, no new furniture, no carpets. She has never been testing for allergies. She does have seasonal allergies which vary, she sometimes takes Careers adviserallegra. She has been on inhalers in the past.   She does snore at night, her husband is present and gives this history. She occasionally stops breathing at night. She is sleepy during the day and will sometimes take a nap.    HPI She is doing well No acute issues No SOB or DOE Off of oxygen No wheezing Well controlled on advair HFA No signs of infection at this time GERD not under control with current meds  Medication:   Outpatient Encounter Prescriptions as of 07/31/2016  Medication Sig  . albuterol (ACCUNEB) 1.25 MG/3ML nebulizer solution Inhale 3 mLs into the lungs every 6 (six) hours as needed.   Marland Kitchen. albuterol (PROVENTIL HFA;VENTOLIN HFA) 108 (90 Base) MCG/ACT inhaler Inhale into the lungs.  Marland Kitchen. atorvastatin (LIPITOR) 20 MG tablet Take 20 mg by  mouth daily.   . fluticasone (FLONASE) 50 MCG/ACT nasal spray Place 2 sprays into the nose daily.   . fluticasone-salmeterol (ADVAIR HFA) 230-21 MCG/ACT inhaler Inhale 2 puffs into the lungs 2 (two) times daily.  Marland Kitchen. lisinopril-hydrochlorothiazide (PRINZIDE,ZESTORETIC) 20-25 MG tablet Take 1 tablet by mouth daily.   . montelukast (SINGULAIR) 10 MG tablet Take 10 mg by mouth at bedtime.  . Multiple Vitamin (MULTI-VITAMINS) TABS Take 1 tablet by mouth daily.  Marland Kitchen. omeprazole (PRILOSEC) 20 MG capsule 20 mg daily.    No facility-administered encounter medications on file as of 07/31/2016.      Allergies:  Patient has no known allergies.  Review of Systems: Gen:  Denies  fever, sweats. HEENT: Denies blurred vision. Cvc:  No dizziness, chest pain or heaviness Resp:   Denies cough or sputum porduction. Other:  All other systems were reviewed and found to be negative other than what is mentioned in the HPI.   Physical Examination:   BP 128/88   Pulse 88   Wt 177 lb (80.3 kg)   SpO2 98%   BMI 30.38 kg/m   General Appearance: No distress  Neuro:without focal findings,  speech normal,  HEENT: PERRLA, EOM intact.Mallampati 3 Pulmonary: normal breath sounds, No wheezing.   CardiovascularNormal S1,S2.  No m/r/g.   Skin:   warm, no rash. Extremities: normal, no cyanosis, clubbing.     Assessment and Plan: 61 yo hispanic female  with Mild Intermittent well controlled ASTHMA  ASTHMA -She appears to improved to nearly baseline. -Given the severity of her recent exacerbation. Our goal is to keep her asthma controlled, and to try to prevent future exacerbations. -She has no known triggers, we will continue the patient on Advair HFA 2 puffs twice daily, she is instructed to rinse her mouth after each use. She can also continue albuterol, but she only needs to use it on an as-needed basis.  Acute hypoxic respiratory failure-does not need oxygen at this time -will d/c oxygen  GERD-will change to  Protonox as her GERD is not under control Patient/Family are satisfied with Plan of action and management. All questions answered   Follow up with Dr Ardyth Man in 6 months   Deborah Washington, M.D.  [facility]  Medical Director [facility] Medical Director [facility] Cardio-Pulmonary Department

## 2016-07-31 NOTE — Patient Instructions (Signed)
Change PPI to Protonix Check ambulating Pulse OX continue Advair

## 2016-11-17 ENCOUNTER — Other Ambulatory Visit: Payer: Self-pay | Admitting: Family Medicine

## 2016-11-17 DIAGNOSIS — Z1231 Encounter for screening mammogram for malignant neoplasm of breast: Secondary | ICD-10-CM

## 2016-11-20 ENCOUNTER — Ambulatory Visit
Admission: RE | Admit: 2016-11-20 | Discharge: 2016-11-20 | Disposition: A | Discharge status: Home / Self Care | Payer: BLUE CROSS/BLUE SHIELD | Source: Ambulatory Visit | Attending: Family Medicine | Admitting: Family Medicine

## 2016-11-20 DIAGNOSIS — Z1231 Encounter for screening mammogram for malignant neoplasm of breast: Secondary | ICD-10-CM | POA: Diagnosis present

## 2017-10-29 ENCOUNTER — Encounter: Payer: Self-pay | Admitting: Internal Medicine

## 2018-03-19 IMAGING — MG MM DIGITAL SCREENING BILAT W/ CAD
4 series · 4 of 4 positions shown · non-contrast
Comparison: Previous exam(s).

CLINICAL DATA: Screening.

EXAM:
DIGITAL SCREENING BILATERAL MAMMOGRAM WITH CAD

[R CC]
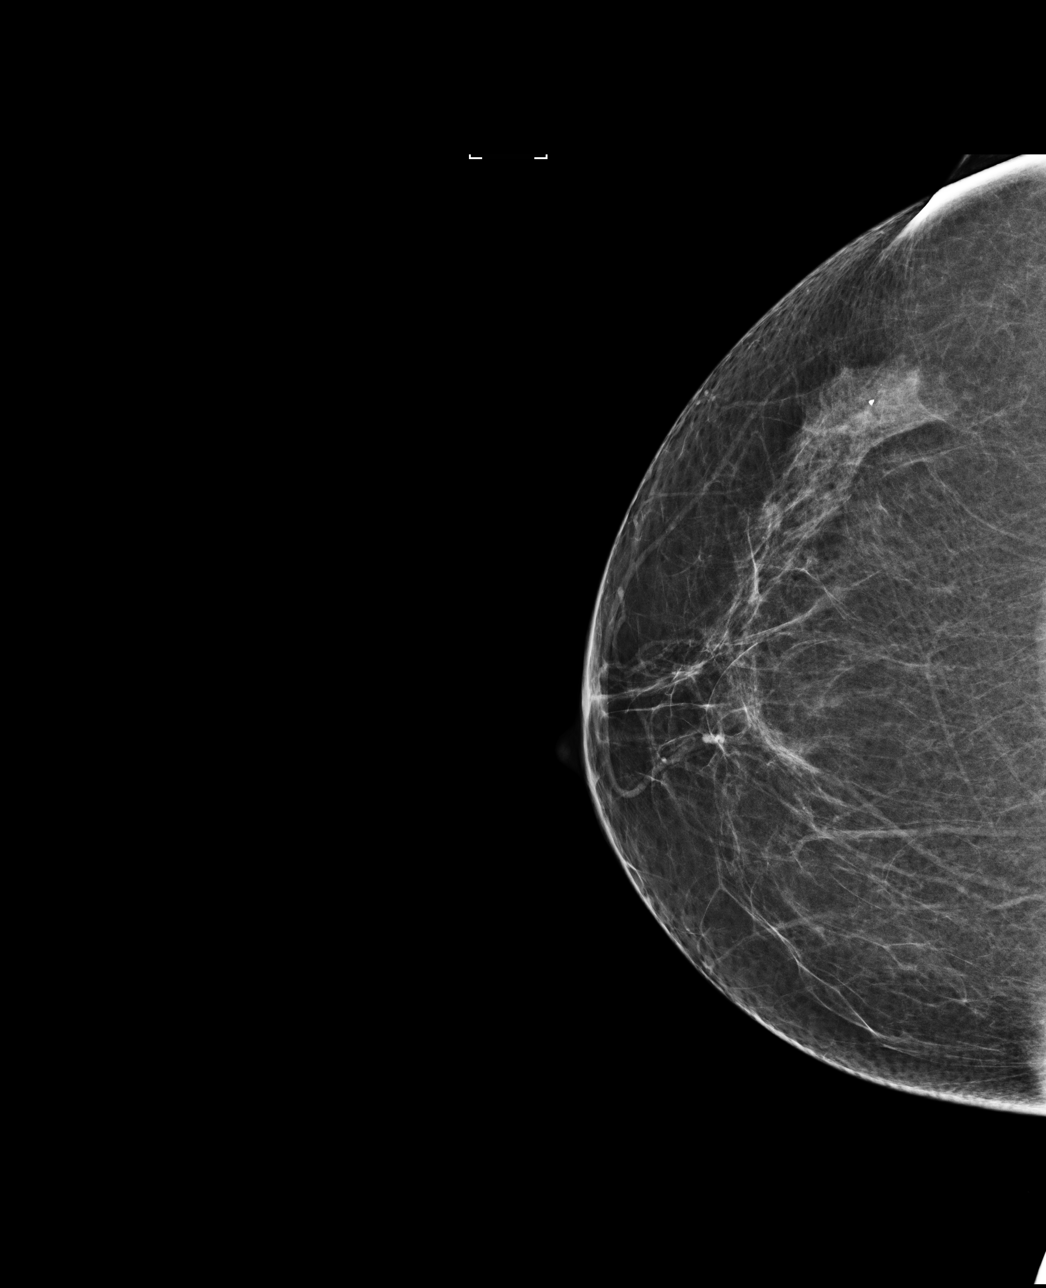

[R MLO]
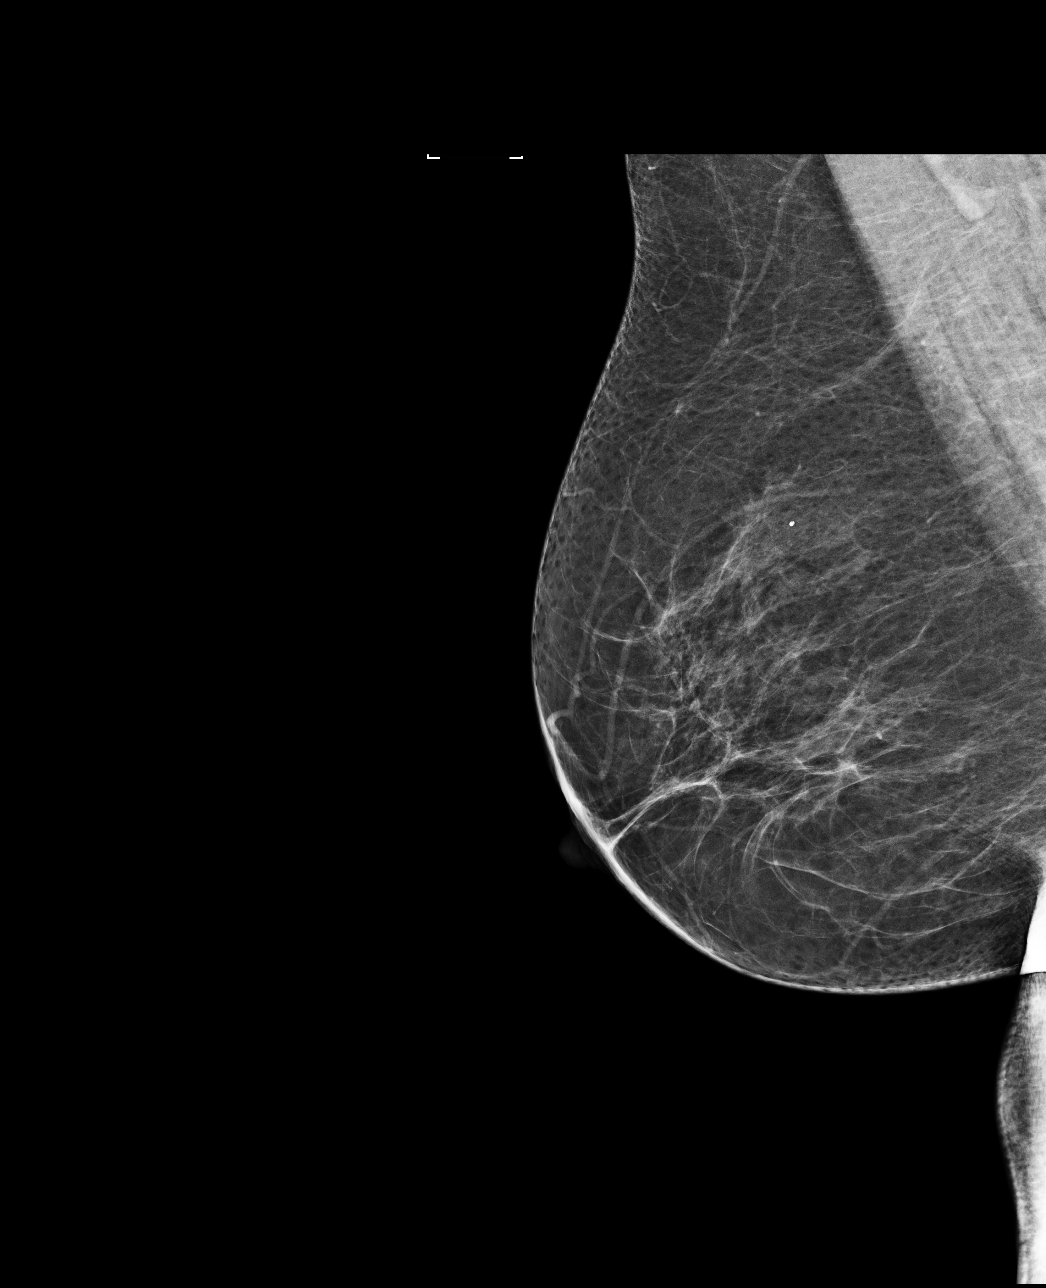

[L MLO]
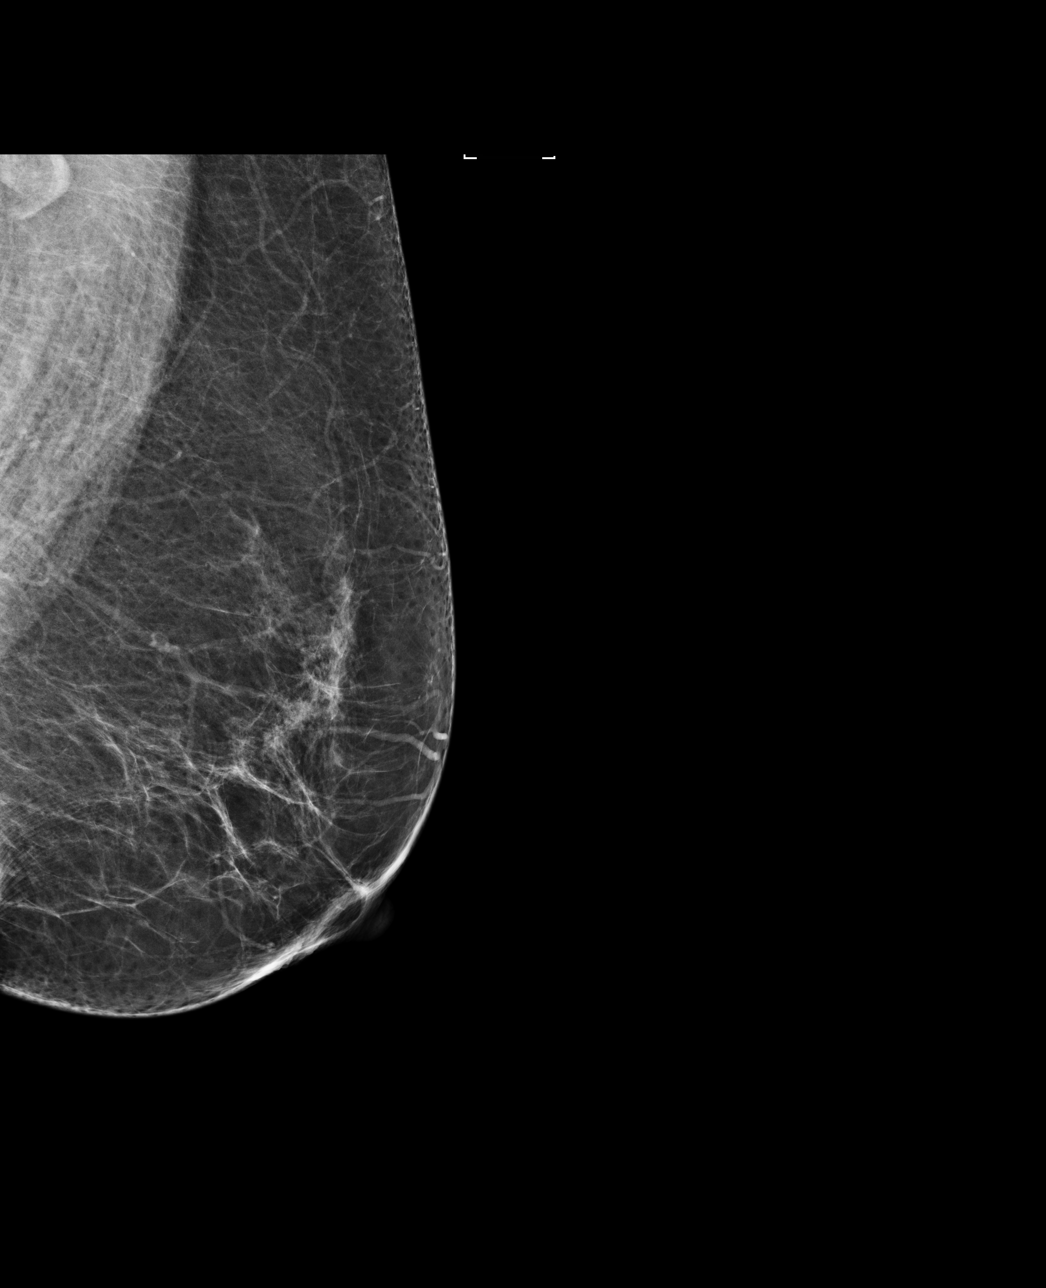

[L CC]
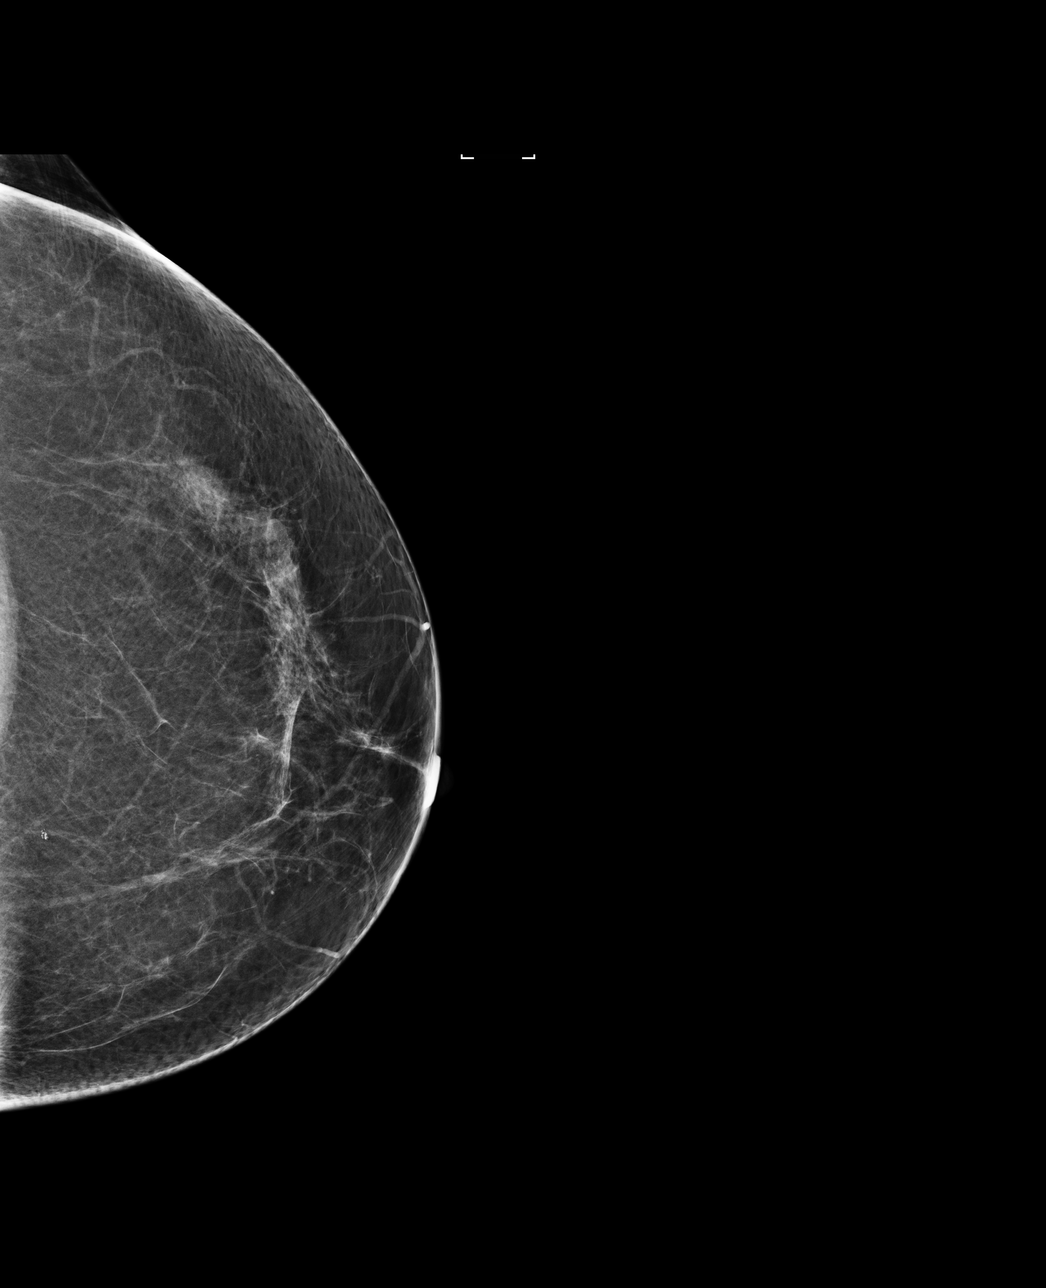

[4 of 4 positions shown; findings below may reference images not displayed]

ACR Breast Density Category b: There are scattered areas of
fibroglandular density.
FINDINGS: There are no findings suspicious for malignancy. Images were
processed with CAD.
IMPRESSION: No mammographic evidence of malignancy. A result letter of this
screening mammogram will be mailed directly to the patient.

RECOMMENDATION:
Screening mammogram in one year. (Code:AS-G-LCT)

BI-RADS CATEGORY  1: Negative.

## 2018-08-13 ENCOUNTER — Other Ambulatory Visit: Payer: Self-pay | Admitting: Family Medicine

## 2018-08-13 ENCOUNTER — Other Ambulatory Visit: Payer: Self-pay

## 2018-08-13 ENCOUNTER — Ambulatory Visit
Admission: RE | Admit: 2018-08-13 | Discharge: 2018-08-13 | Disposition: A | Discharge status: Home / Self Care | Payer: BLUE CROSS/BLUE SHIELD | Source: Ambulatory Visit | Attending: Family Medicine | Admitting: Family Medicine

## 2018-08-13 ENCOUNTER — Ambulatory Visit
Admission: RE | Admit: 2018-08-13 | Discharge: 2018-08-13 | Disposition: A | Discharge status: Home / Self Care | Payer: BLUE CROSS/BLUE SHIELD | Attending: Family Medicine | Admitting: Family Medicine

## 2018-08-13 DIAGNOSIS — J4541 Moderate persistent asthma with (acute) exacerbation: Secondary | ICD-10-CM | POA: Diagnosis not present

## 2018-10-15 ENCOUNTER — Other Ambulatory Visit: Payer: Self-pay | Admitting: Family Medicine

## 2018-10-15 DIAGNOSIS — Z1231 Encounter for screening mammogram for malignant neoplasm of breast: Secondary | ICD-10-CM

## 2018-10-22 ENCOUNTER — Ambulatory Visit
Admission: RE | Admit: 2018-10-22 | Discharge: 2018-10-22 | Disposition: A | Discharge status: Home / Self Care | Payer: Self-pay | Source: Ambulatory Visit | Attending: Family Medicine | Admitting: Family Medicine

## 2018-10-22 ENCOUNTER — Encounter (INDEPENDENT_AMBULATORY_CARE_PROVIDER_SITE_OTHER): Payer: Self-pay

## 2018-10-22 ENCOUNTER — Other Ambulatory Visit: Payer: Self-pay

## 2018-10-22 DIAGNOSIS — Z1231 Encounter for screening mammogram for malignant neoplasm of breast: Secondary | ICD-10-CM | POA: Insufficient documentation

## 2021-12-04 ENCOUNTER — Other Ambulatory Visit: Payer: Self-pay | Admitting: Family Medicine

## 2021-12-04 DIAGNOSIS — Z1231 Encounter for screening mammogram for malignant neoplasm of breast: Secondary | ICD-10-CM

## 2021-12-16 ENCOUNTER — Ambulatory Visit
Admission: RE | Admit: 2021-12-16 | Discharge: 2021-12-16 | Disposition: A | Discharge status: Home / Self Care | Payer: Medicare Other | Source: Ambulatory Visit | Attending: Family Medicine | Admitting: Family Medicine

## 2021-12-16 DIAGNOSIS — Z1231 Encounter for screening mammogram for malignant neoplasm of breast: Secondary | ICD-10-CM | POA: Diagnosis present

## 2023-04-17 ENCOUNTER — Other Ambulatory Visit: Payer: Self-pay | Admitting: Family Medicine

## 2023-04-17 DIAGNOSIS — Z1231 Encounter for screening mammogram for malignant neoplasm of breast: Secondary | ICD-10-CM

## 2024-04-22 ENCOUNTER — Other Ambulatory Visit: Payer: Self-pay | Admitting: Family Medicine

## 2024-04-22 DIAGNOSIS — Z1231 Encounter for screening mammogram for malignant neoplasm of breast: Secondary | ICD-10-CM

## 2024-04-22 DIAGNOSIS — Z780 Asymptomatic menopausal state: Secondary | ICD-10-CM
